# Patient Record
Sex: Female | Born: 1970
Health system: Southern US, Community
[De-identification: ages and names within clinical notes are randomized; demographics above are authoritative.]

## PROBLEM LIST (undated history)

## (undated) DIAGNOSIS — D509 Iron deficiency anemia, unspecified: Secondary | ICD-10-CM

## (undated) DIAGNOSIS — I872 Venous insufficiency (chronic) (peripheral): Secondary | ICD-10-CM

## (undated) DIAGNOSIS — G43909 Migraine, unspecified, not intractable, without status migrainosus: Secondary | ICD-10-CM

## (undated) HISTORY — DX: Migraine, unspecified, not intractable, without status migrainosus: G43.909

## (undated) HISTORY — DX: Iron deficiency anemia, unspecified: D50.9

## (undated) HISTORY — DX: Venous insufficiency (chronic) (peripheral): I87.2

---

## 2015-05-23 ENCOUNTER — Telehealth: Payer: Self-pay | Admitting: *Deleted

## 2015-05-23 NOTE — Telephone Encounter (Signed)
No phone number in chart. Unable to call patient.

## 2015-05-24 ENCOUNTER — Encounter: Payer: Self-pay | Admitting: Physician Assistant

## 2015-05-24 ENCOUNTER — Ambulatory Visit (INDEPENDENT_AMBULATORY_CARE_PROVIDER_SITE_OTHER): Payer: BLUE CROSS/BLUE SHIELD | Admitting: Physician Assistant

## 2015-05-24 VITALS — BP 112/80 | HR 79 | Temp 97.8°F | Ht <= 58 in | Wt 113.2 lb

## 2015-05-24 DIAGNOSIS — Z1239 Encounter for other screening for malignant neoplasm of breast: Secondary | ICD-10-CM | POA: Diagnosis not present

## 2015-05-24 DIAGNOSIS — H6123 Impacted cerumen, bilateral: Secondary | ICD-10-CM | POA: Diagnosis not present

## 2015-05-24 DIAGNOSIS — Z0000 Encounter for general adult medical examination without abnormal findings: Secondary | ICD-10-CM | POA: Insufficient documentation

## 2015-05-24 DIAGNOSIS — Z7689 Persons encountering health services in other specified circumstances: Secondary | ICD-10-CM

## 2015-05-24 DIAGNOSIS — H6120 Impacted cerumen, unspecified ear: Secondary | ICD-10-CM | POA: Insufficient documentation

## 2015-05-24 DIAGNOSIS — Z0289 Encounter for other administrative examinations: Secondary | ICD-10-CM | POA: Diagnosis not present

## 2015-05-24 LAB — COMPREHENSIVE METABOLIC PANEL
ALBUMIN: 4.1 g/dL (ref 3.5–5.2)
ALT: 24 U/L (ref 0–35)
AST: 18 U/L (ref 0–37)
Alkaline Phosphatase: 62 U/L (ref 39–117)
BUN: 7 mg/dL (ref 6–23)
CALCIUM: 9.2 mg/dL (ref 8.4–10.5)
CHLORIDE: 104 meq/L (ref 96–112)
CO2: 28 mEq/L (ref 19–32)
CREATININE: 0.49 mg/dL (ref 0.40–1.20)
GFR: 145.51 mL/min (ref >60.00)
Glucose, Bld: 91 mg/dL (ref 70–99)
POTASSIUM: 4.1 meq/L (ref 3.5–5.1)
Sodium: 138 mEq/L (ref 135–145)
Total Bilirubin: 0.5 mg/dL (ref 0.2–1.2)
Total Protein: 7.5 g/dL (ref 6.0–8.3)

## 2015-05-24 LAB — CBC
HEMATOCRIT: 29.1 % — AB (ref 36.0–46.0)
Hemoglobin: 9 g/dL — ABNORMAL LOW (ref 12.0–15.0)
MCHC: 30.9 g/dL (ref 30.0–36.0)
MCV: 69.9 fl — AB (ref 78.0–100.0)
PLATELETS: 388 10*3/uL (ref 150.0–400.0)
RBC: 4.16 Mil/uL (ref 3.87–5.11)
RDW: 20.2 % — ABNORMAL HIGH (ref 11.5–15.5)
WBC: 5.6 10*3/uL (ref 4.0–10.5)

## 2015-05-24 LAB — LIPID PANEL
CHOL/HDL RATIO: 2
CHOLESTEROL: 152 mg/dL (ref 0–200)
HDL: 63.3 mg/dL (ref >39.00)
LDL Cholesterol: 72 mg/dL (ref 0–99)
NONHDL: 88.64
Triglycerides: 81 mg/dL (ref 0.0–149.0)
VLDL: 16.2 mg/dL (ref 0.0–40.0)

## 2015-05-24 LAB — HEMOGLOBIN A1C: Hgb A1c MFr Bld: 6.1 % (ref 4.6–6.5)

## 2015-05-24 LAB — TSH: TSH: 1.87 u[IU]/mL (ref 0.35–4.50)

## 2015-05-24 NOTE — Assessment & Plan Note (Signed)
Order for screening mammogram placed.

## 2015-05-24 NOTE — Progress Notes (Signed)
Patient presents to clinic today as a new patient for annual exam.  Patient is fasting for labs. Body mass index is 24.06 kg/(m^2). Endorses well-balanced diet.   Acute Concerns: Patient endorses trouble with cerumen impactions. Endorses some decreased hearing. Denies tinnitus, ear pain or drainage.  Health Maintenance: Immunizations -- Tetanus up-to-date per patient.  Mammogram -- Overdue. Agrees to have done. Denies family history of breast cancer. PAP -- Overdue. Denies history of abnormal PAP smears. Requests GYN referral.  Past Medical History  Diagnosis Date  . Migraine     Past Surgical History  Procedure Laterality Date  . Cesarean section  2010/2011    No current outpatient prescriptions on file prior to visit.   No current facility-administered medications on file prior to visit.    No Known Allergies  Family History  Problem Relation Age of Onset  . Heart disease Mother   . Hypertension Sister   . Hypertension Brother   . Healthy Child     x 3 -- 42, 40 and 59 years old    Social History   Social History  . Marital Status: Married    Spouse Name: N/A  . Number of Children: 3  . Years of Education: N/A   Occupational History  . Housewife    Social History Main Topics  . Smoking status: Never Smoker   . Smokeless tobacco: Never Used  . Alcohol Use: 0.0 oz/week    0 Standard drinks or equivalent per week     Comment: [employer]   . Drug Use: No  . Sexual Activity: Yes   Other Topics Concern  . Not on file   Social History Narrative   Review of Systems  Constitutional: Negative for fever and weight loss.  HENT: Positive for hearing loss. Negative for ear discharge, ear pain and tinnitus.        + ear wax buildup  Eyes: Negative for blurred vision, double vision, photophobia and pain.  Respiratory: Negative for cough and shortness of breath.   Cardiovascular: Negative for chest pain and palpitations.  Gastrointestinal: Negative for  heartburn, nausea, vomiting, abdominal pain, diarrhea, constipation, blood in stool and melena.  Genitourinary: Negative for dysuria, urgency, frequency, hematuria and flank pain.  Musculoskeletal: Negative for falls.  Neurological: Negative for dizziness, loss of consciousness and headaches.  Endo/Heme/Allergies: Negative for environmental allergies.  Psychiatric/Behavioral: Negative for depression, suicidal ideas, hallucinations and substance abuse. The patient is not nervous/anxious and does not have insomnia.     BP 112/80 mmHg  Pulse 79  Temp(Src) 97.8 F (36.6 C) (Oral)  Ht 4' 9.5" (1.461 m)  Wt 113 lb 3.2 oz (51.347 kg)  BMI 24.06 kg/m2  SpO2 99%  LMP 05/24/2015  Physical Exam  Constitutional: She is oriented to person, place, and time and well-developed, well-nourished, and in no distress.  HENT:  Head: Normocephalic and atraumatic.  Right Ear: Tympanic membrane, external ear and ear canal normal.  Left Ear: Tympanic membrane, external ear and ear canal normal.  Nose: Nose normal. No mucosal edema.  Mouth/Throat: Uvula is midline, oropharynx is clear and moist and mucous membranes are normal. No oropharyngeal exudate or posterior oropharyngeal erythema.  Cerumen impaction noted bilaterally  Eyes: Conjunctivae are normal. Pupils are equal, round, and reactive to light.  Neck: Neck supple. No thyromegaly present.  Cardiovascular: Normal rate, regular rhythm, normal heart sounds and intact distal pulses.   Pulmonary/Chest: Effort normal and breath sounds normal. No respiratory distress. She has no wheezes. She  has no rales.  Abdominal: Soft. Bowel sounds are normal. She exhibits no distension and no mass. There is no tenderness. There is no rebound and no guarding.  Lymphadenopathy:    She has no cervical adenopathy.  Neurological: She is alert and oriented to person, place, and time. No cranial nerve deficit.  Skin: Skin is warm and dry. No rash noted.  Psychiatric: Affect  normal.  Vitals reviewed.   Assessment/Plan: Visit for preventive health examination Depression screen negative. Health Maintenance reviewed -- Referral to GYN placed for routine care and PAP smear. Order for mammogram placed. Immunizations up-to-date per patient. Preventive schedule discussed and handout given in AVS. Will obtain fasting labs today.   Cerumen impaction Removed successfully via irrigation. Hearing improved. Supportive measures reviewed.  Breast cancer screening Order for screening mammogram placed.

## 2015-05-24 NOTE — Assessment & Plan Note (Signed)
Removed successfully via irrigation. Hearing improved. Supportive measures reviewed.

## 2015-05-24 NOTE — Patient Instructions (Signed)
Please go to the lab for blood work.   Our office will call you with your results unless you have chosen to receive results via MyChart.  If your blood work is normal we will follow-up each year for physicals and as scheduled for chronic medical problems.  If anything is abnormal we will treat accordingly and get you in for a follow-up.  Preventive Care for Adults, Female A healthy lifestyle and preventive care can promote health and wellness. Preventive health guidelines for women include the following key practices.  A routine yearly physical is a good way to check with your health care provider about your health and preventive screening. It is a chance to share any concerns and updates on your health and to receive a thorough exam.  Visit your dentist for a routine exam and preventive care every 6 months. Brush your teeth twice a day and floss once a day. Good oral hygiene prevents tooth decay and gum disease.  The frequency of eye exams is based on your age, health, family medical history, use of contact lenses, and other factors. Follow your health care provider's recommendations for frequency of eye exams.  Eat a healthy diet. Foods like vegetables, fruits, whole grains, low-fat dairy products, and lean protein foods contain the nutrients you need without too many calories. Decrease your intake of foods high in solid fats, added sugars, and salt. Eat the right amount of calories for you.Get information about a proper diet from your health care provider, if necessary.  Regular physical exercise is one of the most important things you can do for your health. Most adults should get at least 150 minutes of moderate-intensity exercise (any activity that increases your heart rate and causes you to sweat) each week. In addition, most adults need muscle-strengthening exercises on 2 or more days a week.  Maintain a healthy weight. The body mass index (BMI) is a screening tool to identify possible  weight problems. It provides an estimate of body fat based on height and weight. Your health care provider can find your BMI and can help you achieve or maintain a healthy weight.For adults 20 years and older:  A BMI below 18.5 is considered underweight.  A BMI of 18.5 to 24.9 is normal.  A BMI of 25 to 29.9 is considered overweight.  A BMI of 30 and above is considered obese.  Maintain normal blood lipids and cholesterol levels by exercising and minimizing your intake of saturated fat. Eat a balanced diet with plenty of fruit and vegetables. Blood tests for lipids and cholesterol should begin at age 43 and be repeated every 5 years. If your lipid or cholesterol levels are high, you are over 50, or you are at high risk for heart disease, you may need your cholesterol levels checked more frequently.Ongoing high lipid and cholesterol levels should be treated with medicines if diet and exercise are not working.  If you smoke, find out from your health care provider how to quit. If you do not use tobacco, do not start.  Lung cancer screening is recommended for adults aged 32-80 years who are at high risk for developing lung cancer because of a history of smoking. A yearly low-dose CT scan of the lungs is recommended for people who have at least a 30-pack-year history of smoking and are a current smoker or have quit within the past 15 years. A pack year of smoking is smoking an average of 1 pack of cigarettes a day for 1 year (  for example: 1 pack a day for 30 years or 2 packs a day for 15 years). Yearly screening should continue until the smoker has stopped smoking for at least 15 years. Yearly screening should be stopped for people who develop a health problem that would prevent them from having lung cancer treatment.  If you are pregnant, do not drink alcohol. If you are breastfeeding, be very cautious about drinking alcohol. If you are not pregnant and choose to drink alcohol, do not have more than 1  drink per day. One drink is considered to be 12 ounces (355 mL) of beer, 5 ounces (148 mL) of wine, or 1.5 ounces (44 mL) of liquor.  Avoid use of street drugs. Do not share needles with anyone. Ask for help if you need support or instructions about stopping the use of drugs.  High blood pressure causes heart disease and increases the risk of stroke. Your blood pressure should be checked at least every 1 to 2 years. Ongoing high blood pressure should be treated with medicines if weight loss and exercise do not work.  If you are 28-30 years old, ask your health care provider if you should take aspirin to prevent strokes.  Diabetes screening is done by taking a blood sample to check your blood glucose level after you have not eaten for a certain period of time (fasting). If you are not overweight and you do not have risk factors for diabetes, you should be screened once every 3 years starting at age 65. If you are overweight or obese and you are 38-54 years of age, you should be screened for diabetes every year as part of your cardiovascular risk assessment.  Breast cancer screening is essential preventive care for women. You should practice "breast self-awareness." This means understanding the normal appearance and feel of your breasts and may include breast self-examination. Any changes detected, no matter how small, should be reported to a health care provider. Women in their 25s and 30s should have a clinical breast exam (CBE) by a health care provider as part of a regular health exam every 1 to 3 years. After age 23, women should have a CBE every year. Starting at age 33, women should consider having a mammogram (breast X-ray test) every year. Women who have a family history of breast cancer should talk to their health care provider about genetic screening. Women at a high risk of breast cancer should talk to their health care providers about having an MRI and a mammogram every year.  Breast cancer  gene (BRCA)-related cancer risk assessment is recommended for women who have family members with BRCA-related cancers. BRCA-related cancers include breast, ovarian, tubal, and peritoneal cancers. Having family members with these cancers may be associated with an increased risk for harmful changes (mutations) in the breast cancer genes BRCA1 and BRCA2. Results of the assessment will determine the need for genetic counseling and BRCA1 and BRCA2 testing.  Your health care provider may recommend that you be screened regularly for cancer of the pelvic organs (ovaries, uterus, and vagina). This screening involves a pelvic examination, including checking for microscopic changes to the surface of your cervix (Pap test). You may be encouraged to have this screening done every 3 years, beginning at age 84.  For women ages 42-65, health care providers may recommend pelvic exams and Pap testing every 3 years, or they may recommend the Pap and pelvic exam, combined with testing for human papilloma virus (HPV), every 5 years. Some types  of HPV increase your risk of cervical cancer. Testing for HPV may also be done on women of any age with unclear Pap test results.  Other health care providers may not recommend any screening for nonpregnant women who are considered low risk for pelvic cancer and who do not have symptoms. Ask your health care provider if a screening pelvic exam is right for you.  If you have had past treatment for cervical cancer or a condition that could lead to cancer, you need Pap tests and screening for cancer for at least 20 years after your treatment. If Pap tests have been discontinued, your risk factors (such as having a new sexual partner) need to be reassessed to determine if screening should resume. Some women have medical problems that increase the chance of getting cervical cancer. In these cases, your health care provider may recommend more frequent screening and Pap tests.  Colorectal  cancer can be detected and often prevented. Most routine colorectal cancer screening begins at the age of 78 years and continues through age 12 years. However, your health care provider may recommend screening at an earlier age if you have risk factors for colon cancer. On a yearly basis, your health care provider may provide home test kits to check for hidden blood in the stool. Use of a small camera at the end of a tube, to directly examine the colon (sigmoidoscopy or colonoscopy), can detect the earliest forms of colorectal cancer. Talk to your health care provider about this at age 86, when routine screening begins. Direct exam of the colon should be repeated every 5-10 years through age 59 years, unless early forms of precancerous polyps or small growths are found.  People who are at an increased risk for hepatitis B should be screened for this virus. You are considered at high risk for hepatitis B if:  You were born in a country where hepatitis B occurs often. Talk with your health care provider about which countries are considered high risk.  Your parents were born in a high-risk country and you have not received a shot to protect against hepatitis B (hepatitis B vaccine).  You have HIV or AIDS.  You use needles to inject street drugs.  You live with, or have sex with, someone who has hepatitis B.  You get hemodialysis treatment.  You take certain medicines for conditions like cancer, organ transplantation, and autoimmune conditions.  Hepatitis C blood testing is recommended for all people born from [birth year] through 1965 and any individual with known risks for hepatitis C.  Practice safe sex. Use condoms and avoid high-risk sexual practices to reduce the spread of sexually transmitted infections (STIs). STIs include gonorrhea, chlamydia, syphilis, trichomonas, herpes, HPV, and human immunodeficiency virus (HIV). Herpes, HIV, and HPV are viral illnesses that have no cure. They can result in  disability, cancer, and death.  You should be screened for sexually transmitted illnesses (STIs) including gonorrhea and chlamydia if:  You are sexually active and are younger than 24 years.  You are older than 24 years and your health care provider tells you that you are at risk for this type of infection.  Your sexual activity has changed since you were last screened and you are at an increased risk for chlamydia or gonorrhea. Ask your health care provider if you are at risk.  If you are at risk of being infected with HIV, it is recommended that you take a prescription medicine daily to prevent HIV infection. This is called  preexposure prophylaxis (PrEP). You are considered at risk if:  You are sexually active and do not regularly use condoms or know the HIV status of your partner(s).  You take drugs by injection.  You are sexually active with a partner who has HIV.  Talk with your health care provider about whether you are at high risk of being infected with HIV. If you choose to begin PrEP, you should first be tested for HIV. You should then be tested every 3 months for as long as you are taking PrEP.  Osteoporosis is a disease in which the bones lose minerals and strength with aging. This can result in serious bone fractures or breaks. The risk of osteoporosis can be identified using a bone density scan. Women ages 21 years and over and women at risk for fractures or osteoporosis should discuss screening with their health care providers. Ask your health care provider whether you should take a calcium supplement or vitamin D to reduce the rate of osteoporosis.  Menopause can be associated with physical symptoms and risks. Hormone replacement therapy is available to decrease symptoms and risks. You should talk to your health care provider about whether hormone replacement therapy is right for you.  Use sunscreen. Apply sunscreen liberally and repeatedly throughout the day. You should seek  shade when your shadow is shorter than you. Protect yourself by wearing long sleeves, pants, a wide-brimmed hat, and sunglasses year round, whenever you are outdoors.  Once a month, do a whole body skin exam, using a mirror to look at the skin on your back. Tell your health care provider of new moles, moles that have irregular borders, moles that are larger than a pencil eraser, or moles that have changed in shape or color.  Stay current with required vaccines (immunizations).  Influenza vaccine. All adults should be immunized every year.  Tetanus, diphtheria, and acellular pertussis (Td, Tdap) vaccine. Pregnant women should receive 1 dose of Tdap vaccine during each pregnancy. The dose should be obtained regardless of the length of time since the last dose. Immunization is preferred during the 27th-36th week of gestation. An adult who has not previously received Tdap or who does not know her vaccine status should receive 1 dose of Tdap. This initial dose should be followed by tetanus and diphtheria toxoids (Td) booster doses every 10 years. Adults with an unknown or incomplete history of completing a 3-dose immunization series with Td-containing vaccines should begin or complete a primary immunization series including a Tdap dose. Adults should receive a Td booster every 10 years.  Varicella vaccine. An adult without evidence of immunity to varicella should receive 2 doses or a second dose if she has previously received 1 dose. Pregnant females who do not have evidence of immunity should receive the first dose after pregnancy. This first dose should be obtained before leaving the health care facility. The second dose should be obtained 4-8 weeks after the first dose.  Human papillomavirus (HPV) vaccine. Females aged 13-26 years who have not received the vaccine previously should obtain the 3-dose series. The vaccine is not recommended for use in pregnant females. However, pregnancy testing is not needed  before receiving a dose. If a female is found to be pregnant after receiving a dose, no treatment is needed. In that case, the remaining doses should be delayed until after the pregnancy. Immunization is recommended for any person with an immunocompromised condition through the age of 23 years if she did not get any or all  doses earlier. During the 3-dose series, the second dose should be obtained 4-8 weeks after the first dose. The third dose should be obtained 24 weeks after the first dose and 16 weeks after the second dose.  Zoster vaccine. One dose is recommended for adults aged 62 years or older unless certain conditions are present.  Measles, mumps, and rubella (MMR) vaccine. Adults born before [birth year] generally are considered immune to measles and mumps. Adults born in [birth year] or later should have 1 or more doses of MMR vaccine unless there is a contraindication to the vaccine or there is laboratory evidence of immunity to each of the three diseases. A routine second dose of MMR vaccine should be obtained at least 28 days after the first dose for students attending postsecondary schools, health care workers, or international travelers. People who received inactivated measles vaccine or an unknown type of measles vaccine during 1963-1967 should receive 2 doses of MMR vaccine. People who received inactivated mumps vaccine or an unknown type of mumps vaccine before 1979 and are at high risk for mumps infection should consider immunization with 2 doses of MMR vaccine. For females of childbearing age, rubella immunity should be determined. If there is no evidence of immunity, females who are not pregnant should be vaccinated. If there is no evidence of immunity, females who are pregnant should delay immunization until after pregnancy. Unvaccinated health care workers born before [birth year] who lack laboratory evidence of measles, mumps, or rubella immunity or laboratory confirmation of disease should consider measles and  mumps immunization with 2 doses of MMR vaccine or rubella immunization with 1 dose of MMR vaccine.  Pneumococcal 13-valent conjugate (PCV13) vaccine. When indicated, a person who is uncertain of his immunization history and has no record of immunization should receive the PCV13 vaccine. All adults 3 years of age and older should receive this vaccine. An adult aged 65 years or older who has certain medical conditions and has not been previously immunized should receive 1 dose of PCV13 vaccine. This PCV13 should be followed with a dose of pneumococcal polysaccharide (PPSV23) vaccine. Adults who are at high risk for pneumococcal disease should obtain the PPSV23 vaccine at least 8 weeks after the dose of PCV13 vaccine. Adults older than 45 years of age who have normal immune system function should obtain the PPSV23 vaccine dose at least 1 year after the dose of PCV13 vaccine.  Pneumococcal polysaccharide (PPSV23) vaccine. When PCV13 is also indicated, PCV13 should be obtained first. All adults aged 8 years and older should be immunized. An adult younger than age 56 years who has certain medical conditions should be immunized. Any person who resides in a nursing home or long-term care facility should be immunized. An adult smoker should be immunized. People with an immunocompromised condition and certain other conditions should receive both PCV13 and PPSV23 vaccines. People with human immunodeficiency virus (HIV) infection should be immunized as soon as possible after diagnosis. Immunization during chemotherapy or radiation therapy should be avoided. Routine use of PPSV23 vaccine is not recommended for American Indians, [place] Natives, or people younger than 65 years unless there are medical conditions that require PPSV23 vaccine. When indicated, people who have unknown immunization and have no record of immunization should receive PPSV23 vaccine. One-time revaccination 5 years after the first dose of PPSV23 is  recommended for people aged 19-64 years who have chronic kidney failure, nephrotic syndrome, asplenia, or immunocompromised conditions. People who received 1-2 doses of PPSV23 before age 52 years should receive  another dose of PPSV23 vaccine at age 71 years or later if at least 5 years have passed since the previous dose. Doses of PPSV23 are not needed for people immunized with PPSV23 at or after age 41 years.  Meningococcal vaccine. Adults with asplenia or persistent complement component deficiencies should receive 2 doses of quadrivalent meningococcal conjugate (MenACWY-D) vaccine. The doses should be obtained at least 2 months apart. Microbiologists working with certain meningococcal bacteria, [employer] recruits, people at risk during an outbreak, and people who travel to or live in countries with a high rate of meningitis should be immunized. A first-year college student up through age 84 years who is living in a residence hall should receive a dose if she did not receive a dose on or after her 16th birthday. Adults who have certain high-risk conditions should receive one or more doses of vaccine.  Hepatitis A vaccine. Adults who wish to be protected from this disease, have certain high-risk conditions, work with hepatitis A-infected animals, work in hepatitis A research labs, or travel to or work in countries with a high rate of hepatitis A should be immunized. Adults who were previously unvaccinated and who anticipate close contact with an international adoptee during the first 60 days after arrival in the [place] from a country with a high rate of hepatitis A should be immunized.  Hepatitis B vaccine. Adults who wish to be protected from this disease, have certain high-risk conditions, may be exposed to blood or other infectious body fluids, are household contacts or sex partners of hepatitis B positive people, are clients or workers in certain care facilities, or travel to or work in countries  with a high rate of hepatitis B should be immunized.  Haemophilus influenzae type b (Hib) vaccine. A previously unvaccinated person with asplenia or sickle cell disease or having a scheduled splenectomy should receive 1 dose of Hib vaccine. Regardless of previous immunization, a recipient of a hematopoietic stem cell transplant should receive a 3-dose series 6-12 months after her successful transplant. Hib vaccine is not recommended for adults with HIV infection. Preventive Services / Frequency Ages 36 to 72 years  Blood pressure check.** / Every 3-5 years.  Lipid and cholesterol check.** / Every 5 years beginning at age 41.  Clinical breast exam.** / Every 3 years for women in their 19s and 70s.  BRCA-related cancer risk assessment.** / For women who have family members with a BRCA-related cancer (breast, ovarian, tubal, or peritoneal cancers).  Pap test.** / Every 2 years from ages 10 through 22. Every 3 years starting at age 44 through age 81 or 54 with a history of 3 consecutive normal Pap tests.  HPV screening.** / Every 3 years from ages 43 through ages 49 to 86 with a history of 3 consecutive normal Pap tests.  Hepatitis C blood test.** / For any individual with known risks for hepatitis C.  Skin self-exam. / Monthly.  Influenza vaccine. / Every year.  Tetanus, diphtheria, and acellular pertussis (Tdap, Td) vaccine.** / Consult your health care provider. Pregnant women should receive 1 dose of Tdap vaccine during each pregnancy. 1 dose of Td every 10 years.  Varicella vaccine.** / Consult your health care provider. Pregnant females who do not have evidence of immunity should receive the first dose after pregnancy.  HPV vaccine. / 3 doses over 6 months, if 32 and younger. The vaccine is not recommended for use in pregnant females. However, pregnancy testing is not needed before receiving a dose.  Measles, mumps, rubella (MMR) vaccine.** / You need at least 1 dose of MMR if you  were born in 1957 or later. You may also need a 2nd dose. For females of childbearing age, rubella immunity should be determined. If there is no evidence of immunity, females who are not pregnant should be vaccinated. If there is no evidence of immunity, females who are pregnant should delay immunization until after pregnancy.  Pneumococcal 13-valent conjugate (PCV13) vaccine.** / Consult your health care provider.  Pneumococcal polysaccharide (PPSV23) vaccine.** / 1 to 2 doses if you smoke cigarettes or if you have certain conditions.  Meningococcal vaccine.** / 1 dose if you are age 25 to 49 years and a Market researcher living in a residence hall, or have one of several medical conditions, you need to get vaccinated against meningococcal disease. You may also need additional booster doses.  Hepatitis A vaccine.** / Consult your health care provider.  Hepatitis B vaccine.** / Consult your health care provider.  Haemophilus influenzae type b (Hib) vaccine.** / Consult your health care provider. Ages 69 to 42 years  Blood pressure check.** / Every year.  Lipid and cholesterol check.** / Every 5 years beginning at age 44 years.  Lung cancer screening. / Every year if you are aged 61-80 years and have a 30-pack-year history of smoking and currently smoke or have quit within the past 15 years. Yearly screening is stopped once you have quit smoking for at least 15 years or develop a health problem that would prevent you from having lung cancer treatment.  Clinical breast exam.** / Every year after age 79 years.  BRCA-related cancer risk assessment.** / For women who have family members with a BRCA-related cancer (breast, ovarian, tubal, or peritoneal cancers).  Mammogram.** / Every year beginning at age 30 years and continuing for as long as you are in good health. Consult with your health care provider.  Pap test.** / Every 3 years starting at age 73 years through age 47 or 32 years  with a history of 3 consecutive normal Pap tests.  HPV screening.** / Every 3 years from ages 62 years through ages 66 to 73 years with a history of 3 consecutive normal Pap tests.  Fecal occult blood test (FOBT) of stool. / Every year beginning at age 82 years and continuing until age 39 years. You may not need to do this test if you get a colonoscopy every 10 years.  Flexible sigmoidoscopy or colonoscopy.** / Every 5 years for a flexible sigmoidoscopy or every 10 years for a colonoscopy beginning at age 55 years and continuing until age 67 years.  Hepatitis C blood test.** / For all people born from [birth year] through 1965 and any individual with known risks for hepatitis C.  Skin self-exam. / Monthly.  Influenza vaccine. / Every year.  Tetanus, diphtheria, and acellular pertussis (Tdap/Td) vaccine.** / Consult your health care provider. Pregnant women should receive 1 dose of Tdap vaccine during each pregnancy. 1 dose of Td every 10 years.  Varicella vaccine.** / Consult your health care provider. Pregnant females who do not have evidence of immunity should receive the first dose after pregnancy.  Zoster vaccine.** / 1 dose for adults aged 38 years or older.  Measles, mumps, rubella (MMR) vaccine.** / You need at least 1 dose of MMR if you were born in 1957 or later. You may also need a second dose. For females of childbearing age, rubella immunity should be determined. If there is no  evidence of immunity, females who are not pregnant should be vaccinated. If there is no evidence of immunity, females who are pregnant should delay immunization until after pregnancy.  Pneumococcal 13-valent conjugate (PCV13) vaccine.** / Consult your health care provider.  Pneumococcal polysaccharide (PPSV23) vaccine.** / 1 to 2 doses if you smoke cigarettes or if you have certain conditions.  Meningococcal vaccine.** / Consult your health care provider.  Hepatitis A vaccine.** / Consult your health care  provider.  Hepatitis B vaccine.** / Consult your health care provider.  Haemophilus influenzae type b (Hib) vaccine.** / Consult your health care provider. Ages 12 years and over  Blood pressure check.** / Every year.  Lipid and cholesterol check.** / Every 5 years beginning at age 70 years.  Lung cancer screening. / Every year if you are aged 56-80 years and have a 30-pack-year history of smoking and currently smoke or have quit within the past 15 years. Yearly screening is stopped once you have quit smoking for at least 15 years or develop a health problem that would prevent you from having lung cancer treatment.  Clinical breast exam.** / Every year after age 73 years.  BRCA-related cancer risk assessment.** / For women who have family members with a BRCA-related cancer (breast, ovarian, tubal, or peritoneal cancers).  Mammogram.** / Every year beginning at age 11 years and continuing for as long as you are in good health. Consult with your health care provider.  Pap test.** / Every 3 years starting at age 73 years through age 53 or 59 years with 3 consecutive normal Pap tests. Testing can be stopped between 65 and 70 years with 3 consecutive normal Pap tests and no abnormal Pap or HPV tests in the past 10 years.  HPV screening.** / Every 3 years from ages 50 years through ages 65 or 75 years with a history of 3 consecutive normal Pap tests. Testing can be stopped between 65 and 70 years with 3 consecutive normal Pap tests and no abnormal Pap or HPV tests in the past 10 years.  Fecal occult blood test (FOBT) of stool. / Every year beginning at age 65 years and continuing until age 33 years. You may not need to do this test if you get a colonoscopy every 10 years.  Flexible sigmoidoscopy or colonoscopy.** / Every 5 years for a flexible sigmoidoscopy or every 10 years for a colonoscopy beginning at age 49 years and continuing until age 89 years.  Hepatitis C blood test.** / For all  people born from [birth year] through 1965 and any individual with known risks for hepatitis C.  Osteoporosis screening.** / A one-time screening for women ages 2 years and over and women at risk for fractures or osteoporosis.  Skin self-exam. / Monthly.  Influenza vaccine. / Every year.  Tetanus, diphtheria, and acellular pertussis (Tdap/Td) vaccine.** / 1 dose of Td every 10 years.  Varicella vaccine.** / Consult your health care provider.  Zoster vaccine.** / 1 dose for adults aged 36 years or older.  Pneumococcal 13-valent conjugate (PCV13) vaccine.** / Consult your health care provider.  Pneumococcal polysaccharide (PPSV23) vaccine.** / 1 dose for all adults aged 109 years and older.  Meningococcal vaccine.** / Consult your health care provider.  Hepatitis A vaccine.** / Consult your health care provider.  Hepatitis B vaccine.** / Consult your health care provider.  Haemophilus influenzae type b (Hib) vaccine.** / Consult your health care provider. ** Family history and personal history of risk and conditions may change your health  care provider's recommendations.   This information is not intended to replace advice given to you by your health care provider. Make sure you discuss any questions you have with your health care provider.   Document Released: 03/12/2001 Document Revised: 02/04/2014 Document Reviewed: 06/11/2010 Elsevier Interactive Patient Education [employer]. .

## 2015-05-24 NOTE — Progress Notes (Signed)
Pre visit review using our clinic review tool, if applicable. No additional management support is needed unless otherwise documented below in the visit note. 

## 2015-05-24 NOTE — Addendum Note (Signed)
Addended by: Harl Bowie on: 05/24/2015 10:30 AM   Modules accepted: Orders

## 2015-05-24 NOTE — Assessment & Plan Note (Signed)
Depression screen negative. Health Maintenance reviewed -- Referral to GYN placed for routine care and PAP smear. Order for mammogram placed. Immunizations up-to-date per patient. Preventive schedule discussed and handout given in AVS. Will obtain fasting labs today.

## 2015-05-25 MED ORDER — FERROUS SULFATE 325 (65 FE) MG PO TABS: 325.0000 mg | ORAL_TABLET | Freq: Two times a day (BID) | ORAL | Status: AC

## 2015-05-25 NOTE — Addendum Note (Signed)
Addended by: Dorrene German on: 05/25/2015 02:58 PM   Modules accepted: Orders

## 2015-05-29 ENCOUNTER — Ambulatory Visit (INDEPENDENT_AMBULATORY_CARE_PROVIDER_SITE_OTHER): Payer: BLUE CROSS/BLUE SHIELD | Admitting: Physician Assistant

## 2015-05-29 ENCOUNTER — Encounter: Payer: Self-pay | Admitting: Physician Assistant

## 2015-05-29 VITALS — BP 124/82 | HR 79 | Temp 97.7°F | Resp 16 | Ht <= 58 in | Wt 113.5 lb

## 2015-05-29 DIAGNOSIS — D509 Iron deficiency anemia, unspecified: Secondary | ICD-10-CM | POA: Diagnosis not present

## 2015-05-29 NOTE — Assessment & Plan Note (Addendum)
Suspect heavy cycles are main contributor. She will start iron supplement as directed. Will recheck CBC and iron panel today. Will alter regimen based on results. IFOB given. If not already improving, will also get GI consult.

## 2015-05-29 NOTE — Progress Notes (Signed)
Patient presents to clinic today for follow-up of anemia noted on recent routine labs. Hgb at 9.0. Anemia microcytic.Marland Kitchen Patient endorses history of iron deficiency during pregnancy but no other diagnosis of anemia. Denies blood in stools. Denies stomach pain. Endorses some shortness of breath only with exertion. Denies chest pain or palpitations. LMP just finished on April 26th. Endorses periods are typically heavy, having to change 3-4 max flow pads per day.   Past Medical History  Diagnosis Date  . Migraine     Current Outpatient Prescriptions on File Prior to Visit  Medication Sig Dispense Refill  . ferrous sulfate 325 (65 FE) MG tablet Take 1 tablet (325 mg total) by mouth 2 (two) times daily with a meal. 60 tablet 0   No current facility-administered medications on file prior to visit.    No Known Allergies  Family History  Problem Relation Age of Onset  . Heart disease Mother   . Hypertension Sister   . Hypertension Brother   . Healthy Child     x 3 -- 13, 39 and 46 years old    Social History   Social History  . Marital Status: Married    Spouse Name: N/A  . Number of Children: 3  . Years of Education: N/A   Occupational History  . Housewife    Social History Main Topics  . Smoking status: Never Smoker   . Smokeless tobacco: Never Used  . Alcohol Use: 0.0 oz/week    0 Standard drinks or equivalent per week     Comment: [employer]-Wine   . Drug Use: No  . Sexual Activity: Yes   Other Topics Concern  . None   Social History Narrative   Review of Systems - See HPI.  All other ROS are negative.  BP 124/82 mmHg  Pulse 79  Temp(Src) 97.7 F (36.5 C) (Oral)  Resp 16  Ht 4' 9.5" (1.461 m)  Wt 113 lb 8 oz (51.483 kg)  BMI 24.12 kg/m2  SpO2 98%  LMP 05/24/2015  Physical Exam  Constitutional: She is oriented to person, place, and time and well-developed, well-nourished, and in no distress.  HENT:  Head: Normocephalic and atraumatic.  Eyes: Conjunctivae are  normal.  Cardiovascular: Normal rate, regular rhythm, normal heart sounds and intact distal pulses.   Pulmonary/Chest: Effort normal and breath sounds normal. No respiratory distress. She has no wheezes. She has no rales. She exhibits no tenderness.  Abdominal: Soft. Bowel sounds are normal. She exhibits no distension and no mass. There is no tenderness. There is no rebound and no guarding.  Neurological: She is alert and oriented to person, place, and time.  Skin: Skin is warm and dry. No rash noted.  Psychiatric: Affect normal.  Vitals reviewed.   Recent Results (from the past 2160 hour(s))  CBC     Status: Abnormal   Collection Time: 05/24/15  9:20 AM  Result Value Ref Range   WBC 5.6 4.0 - 10.5 K/uL   RBC 4.16 3.87 - 5.11 Mil/uL   Platelets 388.0 150.0 - 400.0 K/uL   Hemoglobin 9.0 (L) 12.0 - 15.0 g/dL   HCT 29.1 (L) 36.0 - 46.0 %   MCV 69.9 (L) 78.0 - 100.0 fl   MCHC 30.9 30.0 - 36.0 g/dL   RDW 20.2 (H) 11.5 - 15.5 %  Comprehensive metabolic panel     Status: None   Collection Time: 05/24/15  9:20 AM  Result Value Ref Range   Sodium 138 135 - 145  mEq/L   Potassium 4.1 3.5 - 5.1 mEq/L   Chloride 104 96 - 112 mEq/L   CO2 28 19 - 32 mEq/L   Glucose, Bld 91 70 - 99 mg/dL   BUN 7 6 - 23 mg/dL   Creatinine, Ser 0.49 0.40 - 1.20 mg/dL   Total Bilirubin 0.5 0.2 - 1.2 mg/dL   Alkaline Phosphatase 62 39 - 117 U/L   AST 18 0 - 37 U/L   ALT 24 0 - 35 U/L   Total Protein 7.5 6.0 - 8.3 g/dL   Albumin 4.1 3.5 - 5.2 g/dL   Calcium 9.2 8.4 - 10.5 mg/dL   GFR 145.51 >60.00 mL/min  Hemoglobin A1c     Status: None   Collection Time: 05/24/15  9:20 AM  Result Value Ref Range   Hgb A1c MFr Bld 6.1 4.6 - 6.5 %    Comment: Glycemic Control Guidelines for People with Diabetes:Non Diabetic:  <6%Goal of Therapy: <7%Additional Action Suggested:  >8%   Lipid panel     Status: None   Collection Time: 05/24/15  9:20 AM  Result Value Ref Range   Cholesterol 152 0 - 200 mg/dL    Comment: ATP III  Classification       Desirable:  < 200 mg/dL               Borderline High:  200 - 239 mg/dL          High:  > = 240 mg/dL   Triglycerides 81.0 0.0 - 149.0 mg/dL    Comment: Normal:  <150 mg/dLBorderline High:  150 - 199 mg/dL   HDL 63.30 >39.00 mg/dL   VLDL 16.2 0.0 - 40.0 mg/dL   LDL Cholesterol 72 0 - 99 mg/dL   Total CHOL/HDL Ratio 2     Comment:                Men          Women1/2 Average Risk     3.4          3.3Average Risk          5.0          4.42X Average Risk          9.6          7.13X Average Risk          15.0          11.0                       NonHDL 88.64     Comment: NOTE:  Non-HDL goal should be 30 mg/dL higher than patient's LDL goal (i.e. LDL goal of < 70 mg/dL, would have non-HDL goal of < 100 mg/dL)  TSH     Status: None   Collection Time: 05/24/15  9:20 AM  Result Value Ref Range   TSH 1.87 0.35 - 4.50 uIU/mL    Assessment/Plan: Microcytic anemia Suspect heavy cycles are main contributor. She will start iron supplement as directed. Will recheck CBC and iron panel today. Will alter regimen based on results. IFOB given. If not already improving, will also get GI consult.

## 2015-05-29 NOTE — Patient Instructions (Signed)
Please go to the lab for repeat blood work. I will call with your results. Please stay hydrated. Since insurance will not pay, you will need to get an over-the-counter iron supplement 325 mg to take twice daily.   We will alter regimen based on results. I feel your heavy menstrual flow is a big contributor. Now that you are off of your period the levels should improve. If no change, we will need to get a consult with Gastroenterology.

## 2015-05-30 LAB — IRON AND TIBC
%SAT: 88 % — ABNORMAL HIGH (ref 11–50)
Iron: 387 ug/dL — ABNORMAL HIGH (ref 40–190)
TIBC: 442 ug/dL (ref 250–450)
UIBC: 55 ug/dL — ABNORMAL LOW (ref 125–400)

## 2015-05-30 LAB — CBC
HEMATOCRIT: 27.7 % — AB (ref 36.0–46.0)
MCHC: 30.9 g/dL (ref 30.0–36.0)
PLATELETS: 402 10*3/uL — AB (ref 150.0–400.0)
RBC: 3.97 Mil/uL (ref 3.87–5.11)
RDW: 19.5 % — ABNORMAL HIGH (ref 11.5–15.5)
WBC: 8 10*3/uL (ref 4.0–10.5)

## 2015-05-30 LAB — FERRITIN: Ferritin: 14 ng/mL (ref 10.0–291.0)

## 2015-06-02 ENCOUNTER — Other Ambulatory Visit: Payer: BLUE CROSS/BLUE SHIELD

## 2015-06-05 ENCOUNTER — Other Ambulatory Visit (INDEPENDENT_AMBULATORY_CARE_PROVIDER_SITE_OTHER): Payer: BLUE CROSS/BLUE SHIELD

## 2015-06-05 DIAGNOSIS — D509 Iron deficiency anemia, unspecified: Secondary | ICD-10-CM

## 2015-06-05 NOTE — Addendum Note (Signed)
Addended by: Peggyann Shoals on: 06/05/2015 02:57 PM   Modules accepted: Orders

## 2015-06-05 NOTE — Addendum Note (Signed)
Addended by: Peggyann Shoals on: 06/05/2015 02:56 PM   Modules accepted: Orders

## 2015-06-06 LAB — FECAL OCCULT BLOOD, IMMUNOCHEMICAL: FECAL OCCULT BLD: NEGATIVE

## 2015-06-07 ENCOUNTER — Other Ambulatory Visit: Payer: Self-pay | Admitting: Physician Assistant

## 2015-06-07 ENCOUNTER — Encounter: Payer: Self-pay | Admitting: Internal Medicine

## 2015-06-07 ENCOUNTER — Telehealth: Payer: Self-pay | Admitting: Physician Assistant

## 2015-06-07 DIAGNOSIS — D509 Iron deficiency anemia, unspecified: Secondary | ICD-10-CM

## 2015-06-07 NOTE — Telephone Encounter (Signed)
Returned call. No answer, left message to return call.

## 2015-06-07 NOTE — Telephone Encounter (Signed)
Called pt and left detailed message per DPR w/ below instructions. Instructed to return call to schedule lab appt.   Notes Recorded by Brunetta Jeans, PA-C on 06/07/2015 at 8:21 AM Referral to Hematology and GI placed.  No Advil or NSAIDs due to anemia. She can use Tylenol if needed. I am glad she is feeling better. Want her to return to lab this Friday for final repeat CBC. If she develops any lightheadedness, SOB, palpitations they need to go to ER immediately.

## 2015-06-07 NOTE — Telephone Encounter (Signed)
Pt called back in. Scheduled lab appt per note. Pt says that she received vm from nurse but still would like a call back    CB: [phone]

## 2015-06-07 NOTE — Telephone Encounter (Signed)
Pt returned call for lab results. Request call back.  CB:[phone]

## 2015-06-07 NOTE — Telephone Encounter (Signed)
Pt returned call. Reviewed result note below. Pt w/ multiple concerns. Asked several times if Einar Pheasant would be in the office on Friday in case she had questions for him. Explained to pt x3 that she is coming on Friday for lab only appt, and she will not see Einar Pheasant that day. Pt encouraged to relay her questions at time of call to be addressed.  1. Pt wanted to know if she could still exercise on the treadmill given her anemia. Advised pt no strict exercise precaution documented, however, would recommend avoiding strenuous exercise such as running on the treadmill as this would likely exacerbate her SOB. Reiterated ED precautions.   2. Pt inquiring when she should take her iron supplement. Advised to take BID w/ meals as directed, no specific time, but to try to take around the same time each day. States she is currently taking 1 dose around 12-1pm and wanted to know when she should take second dose. Advised she could take other dose first thing in the morning w/ breakfast or later in the evening after dinner or w/ a snack at night.   3. Pt requested referral for "varicose veins" in her legs. She states she wants someone to take them out. Reports Einar Pheasant has not seen her for this before. Advised she would likely need OV w/ [facility] before he would be able to place referral. Please advise.   Pt verbalized understanding of above instructions, no further questions or concerns at time of call. [facility], please advise if any changes/further recommendations.

## 2015-06-07 NOTE — Telephone Encounter (Signed)
She will need OV to discuss venous varicosities and associated symptoms. Other instructions look good. Remind her about lab appointment Friday. If she does want to speak with me personally Friday, she should schedule OV that day.

## 2015-06-08 ENCOUNTER — Other Ambulatory Visit: Payer: Self-pay | Admitting: *Deleted

## 2015-06-08 DIAGNOSIS — D649 Anemia, unspecified: Secondary | ICD-10-CM

## 2015-06-08 NOTE — Telephone Encounter (Signed)
Called pt and left detailed message that she would need to call the office to schedule an appointment with [facility] for evaluation of varicose veins. Advised she could schedule on Friday 5/12 if she would like since she is already coming in for labs.

## 2015-06-09 ENCOUNTER — Encounter: Payer: Self-pay | Admitting: Physician Assistant

## 2015-06-09 ENCOUNTER — Ambulatory Visit (INDEPENDENT_AMBULATORY_CARE_PROVIDER_SITE_OTHER): Payer: BLUE CROSS/BLUE SHIELD | Admitting: Physician Assistant

## 2015-06-09 ENCOUNTER — Other Ambulatory Visit: Payer: BLUE CROSS/BLUE SHIELD

## 2015-06-09 VITALS — BP 106/74 | HR 91 | Temp 98.7°F | Resp 16 | Ht <= 58 in | Wt 112.4 lb

## 2015-06-09 DIAGNOSIS — R011 Cardiac murmur, unspecified: Secondary | ICD-10-CM

## 2015-06-09 DIAGNOSIS — I872 Venous insufficiency (chronic) (peripheral): Secondary | ICD-10-CM

## 2015-06-09 DIAGNOSIS — D509 Iron deficiency anemia, unspecified: Secondary | ICD-10-CM

## 2015-06-09 LAB — CBC
HCT: 33 % — ABNORMAL LOW (ref 36.0–46.0)
Hemoglobin: 10.3 g/dL — ABNORMAL LOW (ref 12.0–15.0)
MCHC: 31.2 g/dL (ref 30.0–36.0)
MCV: 74.4 fl — AB (ref 78.0–100.0)
Platelets: 325 10*3/uL (ref 150.0–400.0)
RBC: 4.44 Mil/uL (ref 3.87–5.11)
RDW: 28.4 % — ABNORMAL HIGH (ref 11.5–15.5)
WBC: 5.8 10*3/uL (ref 4.0–10.5)

## 2015-06-09 NOTE — Patient Instructions (Signed)
Please go to the lab for blood work. I will call you with results. Please continue your iron supplement as directed. Go to appointments with the Gastroenterologist and Hematologist as scheduled. If anemia is worsening on your labs I may have to send you to the ER. Also if you notice any shortness of breath, racing heart, lightheadedness please have someone take you to the ER or call 911.  You will be contacted for an echocardiogram and for assessment by Vascular Surgery.

## 2015-06-09 NOTE — Progress Notes (Signed)
Patient presents to clinic today for follow-up of iron deficiency anemia and to discuss acute concerns.  IDA -- Last hemoglobin/hematocrit noted to be at 8.6/27.7. Patient had just started iron supplementation at time of lab draw. Endorses taking twice daily as directed. IFOB was obtained and negative. Still denies lightheadedness, dizziness, palpitations or SOB. Referral to Hematology and GI have already been placed. Appointments pending with Hematology. GI appointment is scheduled for 5/18  Patient c/o long-standing history of varicose veins in leges bilaterally since the birth of her first child. Endorses aching pain in posterior legs with prolonged standing. Denies swelling. Denies skin changes but states the varicose veins have enlarged in size. Would like to see a specialist regarding regarding this.   Past Medical History  Diagnosis Date  . Migraine     Current Outpatient Prescriptions on File Prior to Visit  Medication Sig Dispense Refill  . Ascorbic Acid (VITAMIN C) 100 MG tablet Take 100 mg by mouth daily as needed (Migraines).     . ferrous sulfate 325 (65 FE) MG tablet Take 1 tablet (325 mg total) by mouth 2 (two) times daily with a meal. 60 tablet 0  . ibuprofen (ADVIL,MOTRIN) 200 MG tablet Take 200 mg by mouth every 8 (eight) hours as needed.     No current facility-administered medications on file prior to visit.    No Known Allergies  Family History  Problem Relation Age of Onset  . Heart disease Mother   . Hypertension Sister   . Hypertension Brother   . Healthy Child     x 3 -- 67, 42 and 14 years old    Social History   Social History  . Marital Status: Married    Spouse Name: N/A  . Number of Children: 3  . Years of Education: N/A   Occupational History  . Housewife    Social History Main Topics  . Smoking status: Never Smoker   . Smokeless tobacco: Never Used  . Alcohol Use: 0.0 oz/week    0 Standard drinks or equivalent per week     Comment:  Oc[employer]-Wine   . Drug Use: No  . Sexual Activity: Yes   Other Topics Concern  . None   Social History Narrative   Review of Systems - See HPI.  All other ROS are negative.  Ht 4' 9.5" (1.461 m)  Wt 112 lb 6 oz (50.973 kg)  BMI 23.88 kg/m2  LMP 05/24/2015  Physical Exam  Constitutional: She is oriented to person, place, and time and well-developed, well-nourished, and in no distress.  HENT:  Head: Normocephalic and atraumatic.  Mouth/Throat: Oropharynx is clear and moist.  Eyes: Conjunctivae are normal.  Neck: Neck supple.  Cardiovascular: Normal rate, regular rhythm and intact distal pulses.   Murmur heard.  Systolic murmur is present with a grade of 2/6  Pulses:      Popliteal pulses are 2+ on the right side, and 2+ on the left side.       Dorsalis pedis pulses are 2+ on the right side, and 2+ on the left side.       Posterior tibial pulses are 2+ on the right side, and 2+ on the left side.  Venous varicosities noted of bilateral posterior lower extremities with L worse than R. No edema noted. Good pulses. No evidence of skin changes associated with venous stasis dermatitis.   Neurological: She is alert and oriented to person, place, and time.  Skin: Skin is warm and  dry. No rash noted.  Vitals reviewed.   Recent Results (from the past 2160 hour(s))  CBC     Status: Abnormal   Collection Time: 05/24/15  9:20 AM  Result Value Ref Range   WBC 5.6 4.0 - 10.5 K/uL   RBC 4.16 3.87 - 5.11 Mil/uL   Platelets 388.0 150.0 - 400.0 K/uL   Hemoglobin 9.0 (L) 12.0 - 15.0 g/dL   HCT 29.1 (L) 36.0 - 46.0 %   MCV 69.9 (L) 78.0 - 100.0 fl   MCHC 30.9 30.0 - 36.0 g/dL   RDW 20.2 (H) 11.5 - 15.5 %  Comprehensive metabolic panel     Status: None   Collection Time: 05/24/15  9:20 AM  Result Value Ref Range   Sodium 138 135 - 145 mEq/L   Potassium 4.1 3.5 - 5.1 mEq/L   Chloride 104 96 - 112 mEq/L   CO2 28 19 - 32 mEq/L   Glucose, Bld 91 70 - 99 mg/dL   BUN 7 6 - 23 mg/dL    Creatinine, Ser 0.49 0.40 - 1.20 mg/dL   Total Bilirubin 0.5 0.2 - 1.2 mg/dL   Alkaline Phosphatase 62 39 - 117 U/L   AST 18 0 - 37 U/L   ALT 24 0 - 35 U/L   Total Protein 7.5 6.0 - 8.3 g/dL   Albumin 4.1 3.5 - 5.2 g/dL   Calcium 9.2 8.4 - 10.5 mg/dL   GFR 145.51 >60.00 mL/min  Hemoglobin A1c     Status: None   Collection Time: 05/24/15  9:20 AM  Result Value Ref Range   Hgb A1c MFr Bld 6.1 4.6 - 6.5 %    Comment: Glycemic Control Guidelines for People with Diabetes:Non Diabetic:  <6%Goal of Therapy: <7%Additional Action Suggested:  >8%   Lipid panel     Status: None   Collection Time: 05/24/15  9:20 AM  Result Value Ref Range   Cholesterol 152 0 - 200 mg/dL    Comment: ATP III Classification       Desirable:  < 200 mg/dL               Borderline High:  200 - 239 mg/dL          High:  > = 240 mg/dL   Triglycerides 81.0 0.0 - 149.0 mg/dL    Comment: Normal:  <150 mg/dLBorderline High:  150 - 199 mg/dL   HDL 63.30 >39.00 mg/dL   VLDL 16.2 0.0 - 40.0 mg/dL   LDL Cholesterol 72 0 - 99 mg/dL   Total CHOL/HDL Ratio 2     Comment:                Men          Women1/2 Average Risk     3.4          3.3Average Risk          5.0          4.42X Average Risk          9.6          7.13X Average Risk          15.0          11.0                       NonHDL 88.64     Comment: NOTE:  Non-HDL goal should be 30 mg/dL higher than patient's LDL goal (i.e. LDL goal  of < 70 mg/dL, would have non-HDL goal of < 100 mg/dL)  TSH     Status: None   Collection Time: 05/24/15  9:20 AM  Result Value Ref Range   TSH 1.87 0.35 - 4.50 uIU/mL  CBC     Status: Abnormal   Collection Time: 05/29/15  5:01 PM  Result Value Ref Range   WBC 8.0 4.0 - 10.5 K/uL   RBC 3.97 3.87 - 5.11 Mil/uL   Platelets 402.0 (H) 150.0 - 400.0 K/uL   Hemoglobin 8.6 Repeated and verified X2. (L) 12.0 - 15.0 g/dL   HCT 27.7 (L) 36.0 - 46.0 %   MCV 69.8 Repeated and verified X2. (L) 78.0 - 100.0 fl   MCHC 30.9 30.0 - 36.0 g/dL   RDW 19.5  (H) 11.5 - 15.5 %  Ferritin     Status: None   Collection Time: 05/29/15  5:01 PM  Result Value Ref Range   Ferritin 14.0 10.0 - 291.0 ng/mL  Iron Binding Cap (TIBC)     Status: Abnormal   Collection Time: 05/29/15  5:01 PM  Result Value Ref Range   Iron 387 (H) 40 - 190 ug/dL   UIBC 55 (L) 125 - 400 ug/dL   TIBC 442 250 - 450 ug/dL   %SAT 88 (H) 11 - 50 %  Fecal occult blood, imunochemical     Status: None   Collection Time: 06/05/15  3:07 PM  Result Value Ref Range   Fecal Occult Bld Negative Negative    Assessment/Plan: 1. Microcytic anemia Asymptomatic. GI appointment scheduled. Hematology appointment pending. Tolerating iron supplement well without side effects. Will continue. Will repeat STAT CBC today. If stable will continue current regimen. If decreased again, may need ER assessment.  2. Venous insufficiency Referral to Vascular placed for management of varicosities.  3. Undiagnosed cardiac murmurs Asymptomatic. Will order Echo to further assess.

## 2015-06-09 NOTE — Progress Notes (Signed)
Pre visit review using our clinic review tool, if applicable. No additional management support is needed unless otherwise documented below in the visit note/SLS  

## 2015-06-15 ENCOUNTER — Encounter: Payer: Self-pay | Admitting: Internal Medicine

## 2015-06-15 ENCOUNTER — Ambulatory Visit (INDEPENDENT_AMBULATORY_CARE_PROVIDER_SITE_OTHER): Payer: BLUE CROSS/BLUE SHIELD | Admitting: Internal Medicine

## 2015-06-15 VITALS — BP 126/74 | HR 84 | Ht <= 58 in | Wt 113.0 lb

## 2015-06-15 DIAGNOSIS — D509 Iron deficiency anemia, unspecified: Secondary | ICD-10-CM

## 2015-06-15 NOTE — Progress Notes (Signed)
Referred by Elyn Aquas PA-C Subjective:    Patient ID: Jade Kennedy, female    DOB: 11-Jan-1971, 45 y.o.   MRN: [id number] Cc: anemia HPI Very nice 45 yo woman with a new dx of microcytic anemia - ferritin 14 but iron saturation high at 88% She had a negative iFOBT. She has started ferrous sulfate with some rise in Hgb. Denies any GI sxs - no bleeding, change in bowels. She is not a blood donor. She does eat foods with iron. No prior hx anemia known, last medical evaluation in [place] 2-3 yrs ago - moved to [facility] for husband's job. She does vae persistent regular and heavy menses. She bleeds 5-6 d each month and days 4 and 5 are very heavy. No Known Allergies Outpatient Prescriptions Prior to Visit  Medication Sig Dispense Refill  . Ascorbic Acid (VITAMIN C) 100 MG tablet Take 100 mg by mouth daily as needed (Migraines).     . ferrous sulfate 325 (65 FE) MG tablet Take 1 tablet (325 mg total) by mouth 2 (two) times daily with a meal. 60 tablet 0   No facility-administered medications prior to visit.   Past Medical History  Diagnosis Date  . Migraine   . Microcytic anemia   . Venous insufficiency    Past Surgical History  Procedure Laterality Date  . Cesarean section  2010/2011   Social History   Social History  . Marital Status: Married    Spouse Name: N/A  . Number of Children: 3  . Years of Education: N/A   Occupational History  . Housewife    Social History Main Topics  . Smoking status: Never Smoker   . Smokeless tobacco: Never Used  . Alcohol Use: 0.0 oz/week    0 Standard drinks or equivalent per week     Comment: [employer]ine   . Drug Use: No  . Sexual Activity: Yes   Other Topics Concern  . None   Social History Narrative   Family History  Problem Relation Age of Onset  . Heart disease Mother   . Hypertension Sister   . Hypertension Brother   . Healthy Child     x 3 -- 79, 74 and 86 years old    Review of Systems As above    Objective:   Physical  Exam @BP  126/74 mmHg  Pulse 84  Ht 4' 9.5" (1.461 m)  Wt 113 lb (51.256 kg)  BMI 24.01 kg/m2  LMP 05/24/2015@  General:  Well-developed, well-nourished and in no acute distress Eyes:  anicteric. ENT:   Mouth and posterior pharynx free of lesions.  Neck:   supple w/o thyromegaly or mass.  Lungs: Clear to auscultation bilaterally. Heart:  S1S2, no rubs, murmurs, gallops. Abdomen:  soft, non-tender, no hepatosplenomegaly, hernia, or mass and BS+.  Lymph:  no cervical or supraclavicular adenopathy. Extremities:   no edema, cyanosis or clubbing Skin   no rash. Neuro:  A&O x 3.  Psych:  appropriate mood and  Affect.   Data Reviewed: CBC Latest Ref Rng 06/09/2015 05/29/2015 05/24/2015  WBC 4.0 - 10.5 K/uL 5.8 8.0 5.6  Hemoglobin 12.0 - 15.0 g/dL 10.3(L) 8.6 Repeated and verified X2.(L) 9.0(L)  Hematocrit 36.0 - 46.0 % 33.0(L) 27.7(L) 29.1(L)  Platelets 150.0 - 400.0 K/uL 325.0 402.0(H) 388.0    Lab Results  Component Value Date   IRON 387* 05/29/2015   TIBC 442 05/29/2015   FERRITIN 14.0 05/29/2015      Assessment & Plan:   Encounter Diagnosis  Name Primary?  . Iron deficiency anemia Yes   I think this is most likely due to chronic menstrual blood loss. It is odd that ferritin is 14 but iron sat is high at 88% - ? spurious result  She is iFOBT negative so seems like a colonic source is unlikely Will do guaiac cards to check for bleeding from GI tract ? Underlying thalassemia Consider celiac test May not need to see hematology if repeat iron sat makes more sense - will do fasting  [id number], Sandria Manly, PA-C

## 2015-06-15 NOTE — Patient Instructions (Addendum)
   Your physician has requested that you go to the basement for the following lab work when your fasting.   Order given to her to take to [place] in [facility] to have drawn.    Please complete the hemmocult cards and mail/bring them back to the lab.    I appreciate the opportunity to care for you. Silvano Rusk, MD, [facility]

## 2015-06-19 ENCOUNTER — Other Ambulatory Visit (INDEPENDENT_AMBULATORY_CARE_PROVIDER_SITE_OTHER): Payer: BLUE CROSS/BLUE SHIELD

## 2015-06-19 DIAGNOSIS — D509 Iron deficiency anemia, unspecified: Secondary | ICD-10-CM

## 2015-06-19 LAB — IBC PANEL
Iron: 128 ug/dL (ref 42–145)
SATURATION RATIOS: 32.5 % (ref 20.0–50.0)
TRANSFERRIN: 281 mg/dL (ref 212.0–360.0)

## 2015-06-19 NOTE — Progress Notes (Signed)
Quick Note:  Let her know these studies are okay at this time she should remain on iron, return the guaiac cards we gave her and if they were positive we will do endoscopy/colonoscopy oherwise not She should see gynecology regarding her menstrual period problems with bleeding and she has a gynecologist to see already Let her know I spoke to [employer] PA-C and he and I agree that she can cancel her hematology visit on May 25 Please notify Dr.Ennever's office that we are canceling her appointment with him ______

## 2015-06-21 ENCOUNTER — Encounter: Payer: Self-pay | Admitting: Vascular Surgery

## 2015-06-22 ENCOUNTER — Ambulatory Visit: Payer: BLUE CROSS/BLUE SHIELD | Admitting: Hematology & Oncology

## 2015-06-22 ENCOUNTER — Other Ambulatory Visit: Payer: Self-pay | Admitting: *Deleted

## 2015-06-22 ENCOUNTER — Ambulatory Visit: Payer: BLUE CROSS/BLUE SHIELD

## 2015-06-22 ENCOUNTER — Encounter: Payer: Self-pay | Admitting: Vascular Surgery

## 2015-06-22 ENCOUNTER — Other Ambulatory Visit: Payer: BLUE CROSS/BLUE SHIELD

## 2015-06-22 DIAGNOSIS — I83893 Varicose veins of bilateral lower extremities with other complications: Secondary | ICD-10-CM

## 2015-06-23 ENCOUNTER — Other Ambulatory Visit: Payer: Self-pay | Admitting: *Deleted

## 2015-06-23 DIAGNOSIS — I872 Venous insufficiency (chronic) (peripheral): Secondary | ICD-10-CM

## 2015-06-27 ENCOUNTER — Ambulatory Visit (HOSPITAL_COMMUNITY)
Admission: RE | Admit: 2015-06-27 | Discharge: 2015-06-27 | Disposition: A | Discharge status: Home / Self Care | Payer: BLUE CROSS/BLUE SHIELD | Source: Ambulatory Visit | Attending: Vascular Surgery | Admitting: Vascular Surgery

## 2015-06-27 ENCOUNTER — Ambulatory Visit (INDEPENDENT_AMBULATORY_CARE_PROVIDER_SITE_OTHER): Payer: BLUE CROSS/BLUE SHIELD | Admitting: Vascular Surgery

## 2015-06-27 ENCOUNTER — Encounter: Payer: Self-pay | Admitting: Vascular Surgery

## 2015-06-27 ENCOUNTER — Telehealth: Payer: Self-pay | Admitting: Physician Assistant

## 2015-06-27 VITALS — BP 147/92 | HR 60 | Temp 97.7°F | Resp 18 | Ht <= 58 in | Wt 112.2 lb

## 2015-06-27 DIAGNOSIS — I872 Venous insufficiency (chronic) (peripheral): Secondary | ICD-10-CM | POA: Insufficient documentation

## 2015-06-27 DIAGNOSIS — I8393 Asymptomatic varicose veins of bilateral lower extremities: Secondary | ICD-10-CM | POA: Diagnosis not present

## 2015-06-27 DIAGNOSIS — R609 Edema, unspecified: Secondary | ICD-10-CM | POA: Diagnosis present

## 2015-06-27 DIAGNOSIS — I83893 Varicose veins of bilateral lower extremities with other complications: Secondary | ICD-10-CM

## 2015-06-27 DIAGNOSIS — L97929 Non-pressure chronic ulcer of unspecified part of left lower leg with unspecified severity: Principal | ICD-10-CM

## 2015-06-27 DIAGNOSIS — I83029 Varicose veins of left lower extremity with ulcer of unspecified site: Secondary | ICD-10-CM

## 2015-06-27 NOTE — Progress Notes (Signed)
Filed Vitals:   06/27/15 1538 06/27/15 1540  BP: 141/88 147/92  Pulse: 60   Temp: 97.7 F (36.5 C)   TempSrc: Oral   Resp: 18   Height: 4' 10"  (1.473 m)   Weight: 112 lb 3.2 oz (50.894 kg)   SpO2: 100%

## 2015-06-27 NOTE — Telephone Encounter (Signed)
She can get OTC. Continue as directed.

## 2015-06-27 NOTE — Telephone Encounter (Signed)
Can be reached: [phone]   Reason for call: pt called to see if she should continue iron supplement. It's not covered by insurance but wanting to know if she can get OTC. Please call back. Pt has 1 pill for today.

## 2015-06-27 NOTE — Progress Notes (Signed)
[facility] [place]  Patient name: Jade Kennedy MRN: [id number] DOB: [birth year] Sex: female  REASON FOR CONSULT: Here today for discussion of discomfort mainly in her left popliteal fossa with the varicosities and telangiectasia in this area. She denies any swelling bilaterally or any discomfort in her feet. He has no history of DVT  HPI: Jade Kennedy is a 45 y.o. female, who is seen today for discussion of above. She has no known history of DVT or other medical difficulties. She has a large telangiectasia and small varicosities in her popliteal fossa bilaterally more so on the left than on the right and she is here today for discussion of this. She has no major difficulties  Past Medical History  Diagnosis Date  . Migraine   . Microcytic anemia   . Venous insufficiency     Family History  Problem Relation Age of Onset  . Heart disease Mother   . Hypertension Sister   . Hypertension Brother   . Healthy Child     x 3 -- 75, 12 and 70 years old    SOCIAL HISTORY: Social History   Social History  . Marital Status: Married    Spouse Name: N/A  . Number of Children: 3  . Years of Education: N/A   Occupational History  . Housewife    Social History Main Topics  . Smoking status: Never Smoker   . Smokeless tobacco: Never Used  . Alcohol Use: 0.0 oz/week    0 Standard drinks or equivalent per week     Comment: Ocass-Wine   . Drug Use: No  . Sexual Activity: Yes   Other Topics Concern  . Not on file   Social History Narrative    No Known Allergies  Current Outpatient Prescriptions  Medication Sig Dispense Refill  . Ascorbic Acid (VITAMIN C) 100 MG tablet Take 100 mg by mouth daily as needed (Migraines).     . ferrous sulfate 325 (65 FE) MG tablet Take 1 tablet (325 mg total) by mouth 2 (two) times daily with a meal. 60 tablet 0  . ibuprofen (ADVIL,MOTRIN) 200 MG tablet Take 200 mg by mouth every 8 (eight) hours as needed.     No current  facility-administered medications for this visit.    REVIEW OF SYSTEMS:  [X]  denotes positive finding, [ ]  denotes negative finding Cardiac  Comments:  Chest pain or chest pressure:    Shortness of breath upon exertion:    Short of breath when lying flat:    Irregular heart rhythm:        Vascular    Pain in calf, thigh, or hip brought on by ambulation:    Pain in feet at night that wakes you up from your sleep:     Blood clot in your veins:    Leg swelling:         Pulmonary    Oxygen at home:    Productive cough:     Wheezing:         Neurologic    Sudden weakness in arms or legs:     Sudden numbness in arms or legs:     Sudden onset of difficulty speaking or slurred speech:    Temporary loss of vision in one eye:     Problems with dizziness:         Gastrointestinal    Blood in stool:     Vomited blood:         Genitourinary  Burning when urinating:     Blood in urine:        Psychiatric    Major depression:         Hematologic    Bleeding problems:    Problems with blood clotting too easily:        Skin    Rashes or ulcers:        Constitutional    Fever or chills:      PHYSICAL EXAM: Filed Vitals:   06/27/15 1538 06/27/15 1540  BP: 141/88 147/92  Pulse: 60   Temp: 97.7 F (36.5 C)   TempSrc: Oral   Resp: 18   Height: 4' 10"  (1.473 m)   Weight: 112 lb 3.2 oz (50.894 kg)   SpO2: 100%     GENERAL: The patient is a well-nourished female, in no acute distress. The vital signs are documented above. VASCULAR: Palpable dorsalis pedis pulses bilaterally PULMONARY: There is good air exchange  MUSCULOSKELETAL: There are no major deformities or cyanosis. NEUROLOGIC: No focal weakness or paresthesias are detected. SKIN: There are no ulcers or rashes noted. PSYCHIATRIC: The patient has a normal affect. She does have significant telangiectasias scattered over both lower extremities. These are most prominent in her popliteal fossa more so on the left  than on the right. These are small reticular veins that are somewhat raised above the skin surface on the left  . She did undergo formal venous duplex in our office and this shows no evidence of DVT or significant reflux in her deep or superficial system bilaterally.   MEDICAL ISSUES: Discussed this at length with the patient. Explained that she does not have any evidence of serious venous pathology. No evidence saphenous or deep venous reflux. Explained treatment for the telangiectasia would be sclerotherapy. With the large reticular veins in the posterior popliteal fossa I would expect a good outcome. I did explain the slight risk of some skin staining with these larger reticular veins. I expect this would still be a significant cosmetic improvement due to the fact of these are raised above the skin. I did explain the purpose approximate out-of-pocket expense with the sclerotherapy for cosmetic concerned. She will consider this and notify us if she wishes to proceed.   Curt Jews [facility] [employer] [phone]

## 2015-06-28 NOTE — Telephone Encounter (Signed)
Handled.   

## 2015-07-03 ENCOUNTER — Telehealth: Payer: Self-pay | Admitting: Internal Medicine

## 2015-07-03 NOTE — Telephone Encounter (Signed)
Mailbox is full.  I will await a return call, and try again later

## 2015-07-03 NOTE — Telephone Encounter (Signed)
Patient returned call.  I will mail out new hemoccult cards.  She verbalized understanding.

## 2015-09-14 ENCOUNTER — Encounter: Payer: BLUE CROSS/BLUE SHIELD | Admitting: Vascular Surgery

## 2015-09-14 ENCOUNTER — Encounter (HOSPITAL_COMMUNITY): Payer: BLUE CROSS/BLUE SHIELD

## 2015-10-25 ENCOUNTER — Encounter: Payer: Self-pay | Admitting: *Deleted

## 2015-11-01 ENCOUNTER — Ambulatory Visit (INDEPENDENT_AMBULATORY_CARE_PROVIDER_SITE_OTHER): Payer: BLUE CROSS/BLUE SHIELD | Admitting: *Deleted

## 2015-11-01 DIAGNOSIS — I868 Varicose veins of other specified sites: Secondary | ICD-10-CM

## 2015-11-01 DIAGNOSIS — I83893 Varicose veins of bilateral lower extremities with other complications: Secondary | ICD-10-CM

## 2015-11-01 NOTE — Progress Notes (Signed)
X=.3% Sotradecol administered with a 27g butterfly.  Patient received a total of 6cc.  Treated large reticular veins on the back of both legs. Easy access. Tol well. Anticipate good results. Will follow prn.    Compression stockings applied: Yes.

## 2016-02-26 ENCOUNTER — Telehealth: Payer: Self-pay | Admitting: Physician Assistant

## 2016-02-26 NOTE — Telephone Encounter (Signed)
ok 

## 2016-02-26 NOTE — Telephone Encounter (Signed)
Ok with me 

## 2016-02-26 NOTE — Telephone Encounter (Signed)
Patient is requesting a transfer of care from [facility] to Dr. Lorelei Pont. Please advise  Phone: [phone]

## 2016-02-27 NOTE — Telephone Encounter (Signed)
Appt scheduled for 04/11/16

## 2016-04-10 ENCOUNTER — Telehealth: Payer: Self-pay

## 2016-04-10 NOTE — Telephone Encounter (Signed)
Pre Visit call completed.

## 2016-04-11 ENCOUNTER — Other Ambulatory Visit: Payer: Self-pay | Admitting: Emergency Medicine

## 2016-04-11 ENCOUNTER — Ambulatory Visit (INDEPENDENT_AMBULATORY_CARE_PROVIDER_SITE_OTHER): Payer: BLUE CROSS/BLUE SHIELD | Admitting: Family Medicine

## 2016-04-11 VITALS — BP 110/72 | HR 79 | Temp 97.8°F | Ht <= 58 in | Wt 107.8 lb

## 2016-04-11 DIAGNOSIS — D509 Iron deficiency anemia, unspecified: Secondary | ICD-10-CM | POA: Diagnosis not present

## 2016-04-11 DIAGNOSIS — Z13220 Encounter for screening for lipoid disorders: Secondary | ICD-10-CM | POA: Diagnosis not present

## 2016-04-11 DIAGNOSIS — Z131 Encounter for screening for diabetes mellitus: Secondary | ICD-10-CM

## 2016-04-11 LAB — FERRITIN: FERRITIN: 5.2 ng/mL — AB (ref 10.0–291.0)

## 2016-04-11 LAB — COMPREHENSIVE METABOLIC PANEL
ALT: 20 U/L (ref 0–35)
AST: 22 U/L (ref 0–37)
Albumin: 3.9 g/dL (ref 3.5–5.2)
Alkaline Phosphatase: 70 U/L (ref 39–117)
BUN: 13 mg/dL (ref 6–23)
CHLORIDE: 104 meq/L (ref 96–112)
CO2: 27 mEq/L (ref 19–32)
Calcium: 9 mg/dL (ref 8.4–10.5)
Creatinine, Ser: 0.56 mg/dL (ref 0.40–1.20)
GFR: 124.23 mL/min (ref >60.00)
Glucose, Bld: 86 mg/dL (ref 70–99)
POTASSIUM: 3.8 meq/L (ref 3.5–5.1)
SODIUM: 139 meq/L (ref 135–145)
Total Bilirubin: 0.4 mg/dL (ref 0.2–1.2)
Total Protein: 7.4 g/dL (ref 6.0–8.3)

## 2016-04-11 LAB — LIPID PANEL
CHOLESTEROL: 147 mg/dL (ref 0–200)
HDL: 64.9 mg/dL (ref >39.00)
LDL Cholesterol: 73 mg/dL (ref 0–99)
NonHDL: 82.26
Total CHOL/HDL Ratio: 2
Triglycerides: 45 mg/dL (ref 0.0–149.0)
VLDL: 9 mg/dL (ref 0.0–40.0)

## 2016-04-11 LAB — CBC
HCT: 36.3 % (ref 36.0–46.0)
Hemoglobin: 11.7 g/dL — ABNORMAL LOW (ref 12.0–15.0)
MCHC: 32.1 g/dL (ref 30.0–36.0)
MCV: 85.8 fl (ref 78.0–100.0)
Platelets: 273 10*3/uL (ref 150.0–400.0)
RBC: 4.23 Mil/uL (ref 3.87–5.11)
RDW: 13.3 % (ref 11.5–15.5)
WBC: 5.4 10*3/uL (ref 4.0–10.5)

## 2016-04-11 NOTE — Patient Instructions (Signed)
It was nice to see you today!  I will be in touch with your labs We can plan to do your pap/ physical exam at your convenience in the next few months We are going to do your labs today- will check your cholesterol, liver and kidney function, blood sugar, and will look at your anemia. As we discussed, I wonder if you may have a harmless thalassemia which is giving your blood count an abnormal appearance   Take care and I will be in touch with your labs asap

## 2016-04-11 NOTE — Progress Notes (Addendum)
[facility] [address], [place], [place] [address] [phone]  Date:  04/11/2016   Name:  Jade Kennedy   DOB:  13-Apr-1970   MRN:  [id number]  PCP:  Leeanne Rio, PA-C    Chief Complaint: Transfer of Care (Former pt of [facility]. Pt is due for PAP but is on the last day of cycle. Pt not currently taking iron pills would like to discuss if she will need to continue. Also has questions about menstrual cycle and states that she still has headaches, using tylenol for the pain. )   History of Present Illness:  Jade Kennedy is a 46 y.o. very pleasant female patient who presents with the following:  Seen here in 5/17 by Einar Pheasant- he has now moved to a new practice location and she would like to establish with me She has a history of microcytic anemia-  This appears to have been newly discovered last year.  She is not aware of having been told that she was anemic in the past Also varicose veins which are treated at the Vascular and Vein specialist  She was seen by GI regarding her microcytic anemia last year- it appears that they think this is due to menstrual blood losses She had negative FOBT-no colonoscopy done  She is no longer taking her iron- she is not sure if she needs to continue taking this   She had never been told that she was anemic in the past that she can recall except she did take iron during pregnancy   She is feeling well overall She notes that prior she had felt tired and had SOB- however she is now back to normal  She does feel like her menses can be heavy - however this month she is not bleeding as much She will generally bleed for 5 days- 3 days heavily.   Her menses are generally regular   She had 3 kids- s/p BTL She is a stay at home mother  Her youngest is in kindergarten so she is looking at going back to work soon   She is fasting today  She is of [place] descent  Lab Results   Component Value Date   WBC 5.8 06/09/2015   HGB 10.3 (L) 06/09/2015   HCT 33.0 (L) 06/09/2015   MCV 74.4 (L) 06/09/2015   PLT 325.0 06/09/2015     Patient Active Problem List   Diagnosis Date Noted  . Microcytic anemia 05/29/2015  . Visit for preventive health examination 05/24/2015  . Breast cancer screening 05/24/2015  . Referral of patient 05/24/2015    Past Medical History:  Diagnosis Date  . Microcytic anemia   . Migraine   . Venous insufficiency     Past Surgical History:  Procedure Laterality Date  . CESAREAN SECTION  2010/2011    Social History  Substance Use Topics  . Smoking status: Never Smoker  . Smokeless tobacco: Never Used  . Alcohol use 0.0 oz/week     Comment: Ocass-Wine     Family History  Problem Relation Age of Onset  . Heart disease Mother   . Hypertension Sister   . Hypertension Brother   . Healthy Child     x 3 -- 7, 17 and 67 years old    No Known Allergies  Medication list has been reviewed and updated.  Current Outpatient Prescriptions on File Prior to Visit  Medication Sig Dispense Refill  .  Ascorbic Acid (VITAMIN C) 100 MG tablet Take 100 mg by mouth daily as needed (Migraines).     . ferrous sulfate 325 (65 FE) MG tablet Take 1 tablet (325 mg total) by mouth 2 (two) times daily with a meal. (Patient not taking: Reported on 04/11/2016) 60 tablet 0   No current facility-administered medications on file prior to visit.     Review of Systems:  As per HPI- otherwise negative.   Physical Examination: Vitals:   04/11/16 0955  BP: 110/72  Pulse: 79  Temp: 97.8 F (36.6 C)   Vitals:   04/11/16 0955  Weight: 107 lb 12.8 oz (48.9 kg)  Height: 4' 10"  (1.473 m)   Body mass index is 22.53 kg/m. Ideal Body Weight: Weight in (lb) to have BMI = 25: 119.4  GEN: WDWN, NAD, Non-toxic, A & O x 3, petite build, looks well HEENT: Atraumatic, Normocephalic. Neck supple. No masses, No LAD. Ears and Nose: No external deformity. CV:  RRR, No M/G/R. No JVD. No thrill. No extra heart sounds. PULM: CTA B, no wheezes, crackles, rhonchi. No retractions. No resp. distress. No accessory muscle use. ABD: S, NT, ND. No rebound. No HSM. EXTR: No c/c/e NEURO Normal gait.  PSYCH: Normally interactive. Conversant. Not depressed or anxious appearing.  Calm demeanor.    Assessment and Plan: Microcytic anemia - Plan: CBC, Ferritin, Hemoglobinopathy evaluation, Pathologist smear review  Screening for diabetes mellitus - Plan: Comprehensive metabolic panel  Screening for hyperlipidemia - Plan: Lipid panel  Fasting labs pending as above; wonder if she may have a thalassemia as explanation for her apparent anemia. Follow-up with her pending her labs and will plan for a physical and pap soon   Signed Lamar Blinks, MD 3/19- Received her labs so far- her electrophoresis is still pending.  It does appear that she has iron deficiency anemia Received the rest of her labs 3/22  Results for orders placed or performed in visit on 04/11/16  CBC  Result Value Ref Range   WBC 5.4 4.0 - 10.5 K/uL   RBC 4.23 3.87 - 5.11 Mil/uL   Platelets 273.0 150.0 - 400.0 K/uL   Hemoglobin 11.7 (L) 12.0 - 15.0 g/dL   HCT 36.3 36.0 - 46.0 %   MCV 85.8 78.0 - 100.0 fl   MCHC 32.1 30.0 - 36.0 g/dL   RDW 13.3 11.5 - 15.5 %  Comprehensive metabolic panel  Result Value Ref Range   Sodium 139 135 - 145 mEq/L   Potassium 3.8 3.5 - 5.1 mEq/L   Chloride 104 96 - 112 mEq/L   CO2 27 19 - 32 mEq/L   Glucose, Bld 86 70 - 99 mg/dL   BUN 13 6 - 23 mg/dL   Creatinine, Ser 0.56 0.40 - 1.20 mg/dL   Total Bilirubin 0.4 0.2 - 1.2 mg/dL   Alkaline Phosphatase 70 39 - 117 U/L   AST 22 0 - 37 U/L   ALT 20 0 - 35 U/L   Total Protein 7.4 6.0 - 8.3 g/dL   Albumin 3.9 3.5 - 5.2 g/dL   Calcium 9.0 8.4 - 10.5 mg/dL   GFR 124.23 >60.00 mL/min  Ferritin  Result Value Ref Range   Ferritin 5.2 (L) 10.0 - 291.0 ng/mL  Pathologist smear review  Result Value Ref Range    Path Review SEE NOTE   Lipid panel  Result Value Ref Range   Cholesterol 147 0 - 200 mg/dL   Triglycerides 45.0 0.0 - 149.0 mg/dL  HDL 64.90 >39.00 mg/dL   VLDL 9.0 0.0 - 40.0 mg/dL   LDL Cholesterol 73 0 - 99 mg/dL   Total CHOL/HDL Ratio 2    NonHDL 82.26   Hemoglobinopathy Evaluation  Result Value Ref Range   RBC 4.21 3.80 - 5.10 MIL/uL   Hemoglobin 11.5 (L) 11.7 - 15.5 g/dL   HCT 36.7 35.0 - 45.0 %   MCV 87.2 80.0 - 100.0 fL   MCH 27.3 27.0 - 33.0 pg   RDW 13.0 11.0 - 15.0 %   Hgb A 96.9 >96.0 %   Hgb F Quant <1.0 <2.0 %   Hgb A2 Quant 2.1 1.8 - 3.5 %   Interpretation SEE NOTE    Letter to pt.  She does have low ferritin, but her hg is just slightly low.  She does not have any sort of thalassemia.

## 2016-04-12 LAB — PATHOLOGIST SMEAR REVIEW

## 2016-04-15 LAB — HEMOGLOBINOPATHY EVALUATION
HCT: 36.7 % (ref 35.0–45.0)
HEMOGLOBIN: 11.5 g/dL — AB (ref 11.7–15.5)
HGB A2 QUANT: 2.1 % (ref 1.8–3.5)
Hgb A: 96.9 % (ref >96.0)
MCH: 27.3 pg (ref 27.0–33.0)
MCV: 87.2 fL (ref 80.0–100.0)
RBC: 4.21 MIL/uL (ref 3.80–5.10)
RDW: 13 % (ref 11.0–15.0)

## 2016-04-18 ENCOUNTER — Encounter: Payer: Self-pay | Admitting: Family Medicine

## 2016-04-18 NOTE — Progress Notes (Signed)
Results for orders placed or performed in visit on 04/11/16  CBC  Result Value Ref Range   WBC 5.4 4.0 - 10.5 K/uL   RBC 4.23 3.87 - 5.11 Mil/uL   Platelets 273.0 150.0 - 400.0 K/uL   Hemoglobin 11.7 (L) 12.0 - 15.0 g/dL   HCT 36.3 36.0 - 46.0 %   MCV 85.8 78.0 - 100.0 fl   MCHC 32.1 30.0 - 36.0 g/dL   RDW 13.3 11.5 - 15.5 %  Comprehensive metabolic panel  Result Value Ref Range   Sodium 139 135 - 145 mEq/L   Potassium 3.8 3.5 - 5.1 mEq/L   Chloride 104 96 - 112 mEq/L   CO2 27 19 - 32 mEq/L   Glucose, Bld 86 70 - 99 mg/dL   BUN 13 6 - 23 mg/dL   Creatinine, Ser 0.56 0.40 - 1.20 mg/dL   Total Bilirubin 0.4 0.2 - 1.2 mg/dL   Alkaline Phosphatase 70 39 - 117 U/L   AST 22 0 - 37 U/L   ALT 20 0 - 35 U/L   Total Protein 7.4 6.0 - 8.3 g/dL   Albumin 3.9 3.5 - 5.2 g/dL   Calcium 9.0 8.4 - 10.5 mg/dL   GFR 124.23 >60.00 mL/min  Ferritin  Result Value Ref Range   Ferritin 5.2 (L) 10.0 - 291.0 ng/mL  Pathologist smear review  Result Value Ref Range   Path Review SEE NOTE   Lipid panel  Result Value Ref Range   Cholesterol 147 0 - 200 mg/dL   Triglycerides 45.0 0.0 - 149.0 mg/dL   HDL 64.90 >39.00 mg/dL   VLDL 9.0 0.0 - 40.0 mg/dL   LDL Cholesterol 73 0 - 99 mg/dL   Total CHOL/HDL Ratio 2    NonHDL 82.26   Hemoglobinopathy Evaluation  Result Value Ref Range   RBC 4.21 3.80 - 5.10 MIL/uL   Hemoglobin 11.5 (L) 11.7 - 15.5 g/dL   HCT 36.7 35.0 - 45.0 %   MCV 87.2 80.0 - 100.0 fL   MCH 27.3 27.0 - 33.0 pg   RDW 13.0 11.0 - 15.0 %   Hgb A 96.9 >96.0 %   Hgb F Quant <1.0 <2.0 %   Hgb A2 Quant 2.1 1.8 - 3.5 %   Interpretation SEE NOTE

## 2016-05-30 ENCOUNTER — Telehealth: Payer: Self-pay | Admitting: Family Medicine

## 2016-05-30 NOTE — Telephone Encounter (Signed)
Patient dropped off forms to be completed by provider. These are forms her company requires her to have completed and she would like them mailed to her address once completed.

## 2016-05-30 NOTE — Telephone Encounter (Signed)
Forms placed in black bin in front office to be completed

## 2016-06-05 ENCOUNTER — Ambulatory Visit (INDEPENDENT_AMBULATORY_CARE_PROVIDER_SITE_OTHER): Payer: Self-pay | Admitting: *Deleted

## 2016-06-05 DIAGNOSIS — I83893 Varicose veins of bilateral lower extremities with other complications: Secondary | ICD-10-CM

## 2016-06-05 NOTE — Progress Notes (Signed)
X=.3% Sotradecol administered with a 27g butterfly.  Patient received a total of 6cc.  Treated any remaining spider/reticular veins. Unfortunately, she does stain. Hopefully these areas will cont to fade. Easy access. Tol well. Will follow prn.    Compression stockings applied: Yes.

## 2016-06-06 ENCOUNTER — Encounter: Payer: Self-pay | Admitting: *Deleted

## 2016-12-02 DIAGNOSIS — N751 Abscess of Bartholin's gland: Secondary | ICD-10-CM | POA: Diagnosis not present

## 2016-12-02 DIAGNOSIS — N920 Excessive and frequent menstruation with regular cycle: Secondary | ICD-10-CM | POA: Diagnosis not present

## 2016-12-12 DIAGNOSIS — N920 Excessive and frequent menstruation with regular cycle: Secondary | ICD-10-CM | POA: Diagnosis not present

## 2017-01-01 ENCOUNTER — Ambulatory Visit (INDEPENDENT_AMBULATORY_CARE_PROVIDER_SITE_OTHER): Payer: BLUE CROSS/BLUE SHIELD | Admitting: *Deleted

## 2017-01-01 ENCOUNTER — Encounter: Payer: Self-pay | Admitting: *Deleted

## 2017-01-01 DIAGNOSIS — I781 Nevus, non-neoplastic: Secondary | ICD-10-CM

## 2017-01-01 NOTE — Progress Notes (Signed)
X=.3% Sotradecol administered with a 27g butterfly.  Patient received a total of 6cc.  Staining seems to be resolving slowly. Treated a combo of retics and small red spiders. Access was easy and tol well. Hoping her staining will improve. Follow prn.  Photos: Yes.    Compression stockings applied: Yes.

## 2017-01-30 DIAGNOSIS — N751 Abscess of Bartholin's gland: Secondary | ICD-10-CM | POA: Diagnosis not present

## 2017-01-30 DIAGNOSIS — N840 Polyp of corpus uteri: Secondary | ICD-10-CM | POA: Diagnosis not present

## 2017-01-30 DIAGNOSIS — N920 Excessive and frequent menstruation with regular cycle: Secondary | ICD-10-CM | POA: Diagnosis not present

## 2017-01-30 DIAGNOSIS — L811 Chloasma: Secondary | ICD-10-CM | POA: Diagnosis not present

## 2017-01-30 DIAGNOSIS — Z9851 Tubal ligation status: Secondary | ICD-10-CM | POA: Diagnosis not present

## 2017-05-13 DIAGNOSIS — N840 Polyp of corpus uteri: Secondary | ICD-10-CM | POA: Diagnosis not present

## 2017-05-13 DIAGNOSIS — N920 Excessive and frequent menstruation with regular cycle: Secondary | ICD-10-CM | POA: Diagnosis not present

## 2017-05-13 DIAGNOSIS — Z01818 Encounter for other preprocedural examination: Secondary | ICD-10-CM | POA: Diagnosis not present

## 2017-05-23 DIAGNOSIS — N920 Excessive and frequent menstruation with regular cycle: Secondary | ICD-10-CM | POA: Diagnosis not present

## 2017-05-23 DIAGNOSIS — N840 Polyp of corpus uteri: Secondary | ICD-10-CM | POA: Diagnosis not present

## 2017-06-09 DIAGNOSIS — G43009 Migraine without aura, not intractable, without status migrainosus: Secondary | ICD-10-CM | POA: Diagnosis not present

## 2017-06-09 DIAGNOSIS — N920 Excessive and frequent menstruation with regular cycle: Secondary | ICD-10-CM | POA: Diagnosis not present

## 2017-06-09 DIAGNOSIS — G43829 Menstrual migraine, not intractable, without status migrainosus: Secondary | ICD-10-CM | POA: Diagnosis not present

## 2017-06-29 NOTE — Progress Notes (Addendum)
[facility], [place], [place] [address] [phone]  Date:  07/02/2017   Name:  Jade Kennedy   DOB:  12-23-1970   MRN:  [id number]  PCP:  Darreld Mclean, MD    Chief Complaint: Annual Exam (no concerns)   History of Present Illness:  Jade Kennedy is a 47 y.o. very pleasant female patient who presents with the following:  Here today for a CPE History of microcytic anemia  Pap: pt does not remember but per OBG, she believes this is UTD Mammo: this needs to be done - NEVER had as of yet  Labs: over a year ago Immun: we think done 6- 7 years ago during her last pregnancy   I last saw her in 3/18: Seen here in 5/17 by Einar Pheasant- he has now moved to a new practice location and she would like to establish with me She has a history of microcytic anemia-  This appears to have been newly discovered last year.  She is not aware of having been told that she was anemic in the past Also varicose veins which are treated at the [facility] She was seen by GI regarding her microcytic anemia last year- it appears that they think this is due to menstrual blood losses She had negative FOBT-no colonoscopy done She is no longer taking her iron- she is not sure if she needs to continue taking this  She had never been told that she was anemic in the past that she can recall except she did take iron during pregnancy  She notes that prior she had felt tired and had SOB- however she is now back to normal She does feel like her menses can be heavy - however this month she is not bleeding as much She will generally bleed for 5 days- 3 days heavily.   Her menses are generally regular  She had 3 kids- s/p BTL She is a stay at home mother  Her youngest is in kindergarten so she is looking at going back to work soon  She is of [place] descent  Her children are doing well- they are eager for summer break    She is fasting today We did a hemoglobin electrophoresis  for her last year and it was NORMAL- it appears that her microcytic anemia is due to iron deficiency  She is trying to exercise more- she and her oldest child are working out together some   Her GYN is Dr. Earleen Newport [facility] She has given her tranexamic acid to use as needed for heavy menstrual bleeding  Noted that her BP is a little high today- she does have a family history of HTN (mother)   Patient Active Problem List   Diagnosis Date Noted  . Microcytic anemia 05/29/2015  . Visit for preventive health examination 05/24/2015    Past Medical History:  Diagnosis Date  . Microcytic anemia   . Migraine   . Venous insufficiency     Past Surgical History:  Procedure Laterality Date  . CESAREAN SECTION  2010/2011    Social History   Tobacco Use  . Smoking status: Never Smoker  . Smokeless tobacco: Never Used  Substance Use Topics  . 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 Drug use: No    Family History  Problem Relation Age of Onset  . Heart  disease Mother   . Hypertension Sister   . Hypertension Brother   . Healthy Child        x 3 -- 47, 28 and 63 years old    No Known Allergies  Medication list has been reviewed and updated.  Current Outpatient Medications on File Prior to Visit  Medication Sig Dispense Refill  . Ascorbic Acid (VITAMIN C) 100 MG tablet Take 100 mg by mouth daily as needed (Migraines).     . SUMAtriptan (IMITREX) 100 MG tablet Take 100 mg by mouth every 2 (two) hours as needed for migraine. May repeat in 2 hours if headache persists or recurs.    . tranexamic acid (LYSTEDA) 650 MG TABS tablet Take 1,300 mg by mouth 3 (three) times daily. 2 tablets 3 times a day    . ferrous sulfate 325 (65 FE) MG tablet Take 1 tablet (325 mg total) by mouth 2 (two) times daily with a meal. (Patient not taking: Reported on 04/11/2016) 60 tablet 0   No current  facility-administered medications on file prior to visit.     Review of Systems: As per HPI- otherwise negative. BP Readings from Last 3 Encounters:  07/02/17 140/90  04/11/16 110/72  06/27/15 (!) 147/92    Physical Examination: Vitals:   07/02/17 1041  BP: 140/90  Pulse: 68  Resp: 16  SpO2: 100%   Vitals:   07/02/17 1041  Weight: 113 lb 3.2 oz (51.3 kg)  Height: 4' 10"  (1.473 m)   Body mass index is 23.66 kg/m. Ideal Body Weight: Weight in (lb) to have BMI = 25: 119.4  GEN: WDWN, NAD, Non-toxic, A & O x 3, looks well, normal weight  HEENT: Atraumatic, Normocephalic. Neck supple. No masses, No LAD.  Bilateral TM wnl, oropharynx normal.  PEERL,EOMI.   Ears and Nose: No external deformity. CV: RRR, No M/G/R. No JVD. No thrill. No extra heart sounds. PULM: CTA B, no wheezes, crackles, rhonchi. No retractions. No resp. distress. No accessory muscle use. ABD: S, NT, ND, +BS. No rebound. No HSM. EXTR: No c/c/e NEURO Normal gait.  PSYCH: Normally interactive. Conversant. Not depressed or anxious appearing.  Calm demeanor.    Assessment and Plan: Physical exam  Microcytic anemia - Plan: CBC, Ferritin  Screening for diabetes mellitus - Plan: Comprehensive metabolic panel, Hemoglobin A1c  Screening for hyperlipidemia - Plan: Lipid panel  Screening for breast cancer - Plan: MM 3D SCREEN BREAST BILATERAL  CPE today Repeat BP better- reminded pt that regular exercise is importance, esp with her family history of HTN Encouraged mammo today if she can Normal hg electrophoresis last year- it seems that she just had iron def anemia; check levels today Will plan further follow- up pending labs.   Signed Lamar Blinks, MD   Received her labs 6/6, letter to pt  Results for orders placed or performed in visit on 07/02/17  CBC  Result Value Ref Range   WBC 4.7 4.0 - 10.5 K/uL   RBC 4.30 3.87 - 5.11 Mil/uL   Platelets 269.0 150.0 - 400.0 K/uL   Hemoglobin 12.0 12.0 -  15.0 g/dL   HCT 36.5 36.0 - 46.0 %   MCV 84.9 78.0 - 100.0 fl   MCHC 32.8 30.0 - 36.0 g/dL   RDW 15.2 11.5 - 15.5 %  Comprehensive metabolic panel  Result Value Ref Range   Sodium 137 135 - 145 mEq/L   Potassium 3.7 3.5 - 5.1 mEq/L   Chloride 101 96 - 112 mEq/L  CO2 30 19 - 32 mEq/L   Glucose, Bld 83 70 - 99 mg/dL   BUN 10 6 - 23 mg/dL   Creatinine, Ser 0.55 0.40 - 1.20 mg/dL   Total Bilirubin 0.6 0.2 - 1.2 mg/dL   Alkaline Phosphatase 65 39 - 117 U/L   AST 24 0 - 37 U/L   ALT 22 0 - 35 U/L   Total Protein 7.1 6.0 - 8.3 g/dL   Albumin 4.2 3.5 - 5.2 g/dL   Calcium 9.2 8.4 - 10.5 mg/dL   GFR 126.16 >60.00 mL/min  Lipid panel  Result Value Ref Range   Cholesterol 156 0 - 200 mg/dL   Triglycerides 72.0 0.0 - 149.0 mg/dL   HDL 65.40 >39.00 mg/dL   VLDL 14.4 0.0 - 40.0 mg/dL   LDL Cholesterol 76 0 - 99 mg/dL   Total CHOL/HDL Ratio 2    NonHDL 90.56   Hemoglobin A1c  Result Value Ref Range   Hgb A1c MFr Bld 5.8 4.6 - 6.5 %  Ferritin  Result Value Ref Range   Ferritin 5.3 (L) 10.0 - 291.0 ng/mL

## 2017-07-02 ENCOUNTER — Ambulatory Visit (HOSPITAL_BASED_OUTPATIENT_CLINIC_OR_DEPARTMENT_OTHER)
Admission: RE | Admit: 2017-07-02 | Discharge: 2017-07-02 | Disposition: A | Discharge status: Home / Self Care | Payer: BLUE CROSS/BLUE SHIELD | Source: Ambulatory Visit | Attending: Family Medicine | Admitting: Family Medicine

## 2017-07-02 ENCOUNTER — Encounter (HOSPITAL_BASED_OUTPATIENT_CLINIC_OR_DEPARTMENT_OTHER): Payer: Self-pay

## 2017-07-02 ENCOUNTER — Encounter: Payer: Self-pay | Admitting: Family Medicine

## 2017-07-02 ENCOUNTER — Ambulatory Visit (INDEPENDENT_AMBULATORY_CARE_PROVIDER_SITE_OTHER): Payer: BLUE CROSS/BLUE SHIELD | Admitting: Family Medicine

## 2017-07-02 VITALS — BP 110/80 | HR 68 | Resp 16 | Ht <= 58 in | Wt 113.2 lb

## 2017-07-02 DIAGNOSIS — Z13220 Encounter for screening for lipoid disorders: Secondary | ICD-10-CM

## 2017-07-02 DIAGNOSIS — Z1231 Encounter for screening mammogram for malignant neoplasm of breast: Secondary | ICD-10-CM

## 2017-07-02 DIAGNOSIS — D509 Iron deficiency anemia, unspecified: Secondary | ICD-10-CM

## 2017-07-02 DIAGNOSIS — Z0000 Encounter for general adult medical examination without abnormal findings: Secondary | ICD-10-CM | POA: Diagnosis not present

## 2017-07-02 DIAGNOSIS — Z131 Encounter for screening for diabetes mellitus: Secondary | ICD-10-CM | POA: Diagnosis not present

## 2017-07-02 DIAGNOSIS — Z1239 Encounter for other screening for malignant neoplasm of breast: Secondary | ICD-10-CM

## 2017-07-02 LAB — CBC
HCT: 36.5 % (ref 36.0–46.0)
HEMOGLOBIN: 12 g/dL (ref 12.0–15.0)
MCHC: 32.8 g/dL (ref 30.0–36.0)
MCV: 84.9 fl (ref 78.0–100.0)
Platelets: 269 10*3/uL (ref 150.0–400.0)
RBC: 4.3 Mil/uL (ref 3.87–5.11)
RDW: 15.2 % (ref 11.5–15.5)
WBC: 4.7 10*3/uL (ref 4.0–10.5)

## 2017-07-02 LAB — COMPREHENSIVE METABOLIC PANEL
ALBUMIN: 4.2 g/dL (ref 3.5–5.2)
ALK PHOS: 65 U/L (ref 39–117)
ALT: 22 U/L (ref 0–35)
AST: 24 U/L (ref 0–37)
BUN: 10 mg/dL (ref 6–23)
CO2: 30 mEq/L (ref 19–32)
Calcium: 9.2 mg/dL (ref 8.4–10.5)
Chloride: 101 mEq/L (ref 96–112)
Creatinine, Ser: 0.55 mg/dL (ref 0.40–1.20)
GFR: 126.16 mL/min (ref >60.00)
GLUCOSE: 83 mg/dL (ref 70–99)
Potassium: 3.7 mEq/L (ref 3.5–5.1)
Sodium: 137 mEq/L (ref 135–145)
TOTAL PROTEIN: 7.1 g/dL (ref 6.0–8.3)
Total Bilirubin: 0.6 mg/dL (ref 0.2–1.2)

## 2017-07-02 LAB — LIPID PANEL
Cholesterol: 156 mg/dL (ref 0–200)
HDL: 65.4 mg/dL (ref >39.00)
LDL Cholesterol: 76 mg/dL (ref 0–99)
NONHDL: 90.56
Total CHOL/HDL Ratio: 2
Triglycerides: 72 mg/dL (ref 0.0–149.0)
VLDL: 14.4 mg/dL (ref 0.0–40.0)

## 2017-07-02 LAB — FERRITIN: Ferritin: 5.3 ng/mL — ABNORMAL LOW (ref 10.0–291.0)

## 2017-07-02 LAB — HEMOGLOBIN A1C: HEMOGLOBIN A1C: 5.8 % (ref 4.6–6.5)

## 2017-07-02 NOTE — Patient Instructions (Signed)
Great to see you today-  I will check on your iron level and other labs today as well You can stop by the imaging dept on the ground floor today- they may be able to do your mammogram today, or may schedule it for a different day  I'll be in touch with your labs asap Keep trying to get more exercise!  We encourage you to be physically active most days of the week  Health Maintenance, Female Adopting a healthy lifestyle and getting preventive care can go a long way to promote health and wellness. Talk with your health care provider about what schedule of regular examinations is right for you. This is a good chance for you to check in with your provider about disease prevention and staying healthy. In between checkups, there are plenty of things you can do on your own. Experts have done a lot of research about which lifestyle changes and preventive measures are most likely to keep you healthy. Ask your health care provider for more information. Weight and diet Eat a healthy diet  Be sure to include plenty of vegetables, fruits, low-fat dairy products, and lean protein.  Do not eat a lot of foods high in solid fats, added sugars, or salt.  Get regular exercise. This is one of the most important things you can do for your health. ? Most adults should exercise for at least 150 minutes each week. The exercise should increase your heart rate and make you sweat (moderate-intensity exercise). ? Most adults should also do strengthening exercises at least twice a week. This is in addition to the moderate-intensity exercise.  Maintain a healthy weight  Body mass index (BMI) is a measurement that can be used to identify possible weight problems. It estimates body fat based on height and weight. Your health care provider can help determine your BMI and help you achieve or maintain a healthy weight.  For females 24 years of age and older: ? A BMI below 18.5 is considered underweight. ? A BMI of 18.5 to  24.9 is normal. ? A BMI of 25 to 29.9 is considered overweight. ? A BMI of 30 and above is considered obese.  Watch levels of cholesterol and blood lipids  You should start having your blood tested for lipids and cholesterol at 47 years of age, then have this test every 5 years.  You may need to have your cholesterol levels checked more often if: ? Your lipid or cholesterol levels are high. ? You are older than 47 years of age. ? You are at high risk for heart disease.  Cancer screening Lung Cancer  Lung cancer screening is recommended for adults 26-56 years old who are at high risk for lung cancer because of a history of smoking.  A yearly low-dose CT scan of the lungs is recommended for people who: ? Currently smoke. ? Have quit within the past 15 years. ? Have at least a 30-pack-year history of smoking. A pack year is smoking an average of one pack of cigarettes a day for 1 year.  Yearly screening should continue until it has been 15 years since you quit.  Yearly screening should stop if you develop a health problem that would prevent you from having lung cancer treatment.  Breast Cancer  Practice breast self-awareness. This means understanding how your breasts normally appear and feel.  It also means doing regular breast self-exams. Let your health care provider know about any changes, no matter how small.  If  you are in your 20s or 30s, you should have a clinical breast exam (CBE) by a health care provider every 1-3 years as part of a regular health exam.  If you are 63 or older, have a CBE every year. Also consider having a breast X-ray (mammogram) every year.  If you have a family history of breast cancer, talk to your health care provider about genetic screening.  If you are at high risk for breast cancer, talk to your health care provider about having an MRI and a mammogram every year.  Breast cancer gene (BRCA) assessment is recommended for women who have family  members with BRCA-related cancers. BRCA-related cancers include: ? Breast. ? Ovarian. ? Tubal. ? Peritoneal cancers.  Results of the assessment will determine the need for genetic counseling and BRCA1 and BRCA2 testing.  Cervical Cancer Your health care provider may recommend that you be screened regularly for cancer of the pelvic organs (ovaries, uterus, and vagina). This screening involves a pelvic examination, including checking for microscopic changes to the surface of your cervix (Pap test). You may be encouraged to have this screening done every 3 years, beginning at age 55.  For women ages 9-65, health care providers may recommend pelvic exams and Pap testing every 3 years, or they may recommend the Pap and pelvic exam, combined with testing for human papilloma virus (HPV), every 5 years. Some types of HPV increase your risk of cervical cancer. Testing for HPV may also be done on women of any age with unclear Pap test results.  Other health care providers may not recommend any screening for nonpregnant women who are considered low risk for pelvic cancer and who do not have symptoms. Ask your health care provider if a screening pelvic exam is right for you.  If you have had past treatment for cervical cancer or a condition that could lead to cancer, you need Pap tests and screening for cancer for at least 20 years after your treatment. If Pap tests have been discontinued, your risk factors (such as having a new sexual partner) need to be reassessed to determine if screening should resume. Some women have medical problems that increase the chance of getting cervical cancer. In these cases, your health care provider may recommend more frequent screening and Pap tests.  Colorectal Cancer  This type of cancer can be detected and often prevented.  Routine colorectal cancer screening usually begins at 47 years of age and continues through 47 years of age.  Your health care provider may  recommend screening at an earlier age if you have risk factors for colon cancer.  Your health care provider may also recommend using home test kits to check for hidden blood in the stool.  A small camera at the end of a tube can be used to examine your colon directly (sigmoidoscopy or colonoscopy). This is done to check for the earliest forms of colorectal cancer.  Routine screening usually begins at age 44.  Direct examination of the colon should be repeated every 5-10 years through 47 years of age. However, you may need to be screened more often if early forms of precancerous polyps or small growths are found.  Skin Cancer  Check your skin from head to toe regularly.  Tell your health care provider about any new moles or changes in moles, especially if there is a change in a mole's shape or color.  Also tell your health care provider if you have a mole that is larger  than the size of a pencil eraser.  Always use sunscreen. Apply sunscreen liberally and repeatedly throughout the day.  Protect yourself by wearing long sleeves, pants, a wide-brimmed hat, and sunglasses whenever you are outside.  Heart disease, diabetes, and high blood pressure  High blood pressure causes heart disease and increases the risk of stroke. High blood pressure is more likely to develop in: ? People who have blood pressure in the high end of the normal range (130-139/85-89 mm Hg). ? People who are overweight or obese. ? People who are African American.  If you are 9-14 years of age, have your blood pressure checked every 3-5 years. If you are 67 years of age or older, have your blood pressure checked every year. You should have your blood pressure measured twice-once when you are at a hospital or clinic, and once when you are not at a hospital or clinic. Record the average of the two measurements. To check your blood pressure when you are not at a hospital or clinic, you can use: ? An automated blood pressure  machine at a pharmacy. ? A home blood pressure monitor.  If you are between 31 years and 18 years old, ask your health care provider if you should take aspirin to prevent strokes.  Have regular diabetes screenings. This involves taking a blood sample to check your fasting blood sugar level. ? If you are at a normal weight and have a low risk for diabetes, have this test once every three years after 47 years of age. ? If you are overweight and have a high risk for diabetes, consider being tested at a younger age or more often. Preventing infection Hepatitis B  If you have a higher risk for hepatitis B, you should be screened for this virus. You are considered at high risk for hepatitis B if: ? You were born in a country where hepatitis B is common. Ask your health care provider which countries are considered high risk. ? Your parents were born in a high-risk country, and you have not been immunized against hepatitis B (hepatitis B vaccine). ? You have HIV or AIDS. ? You use needles to inject street drugs. ? You live with someone who has hepatitis B. ? You have had sex with someone who has hepatitis B. ? You get hemodialysis treatment. ? You take certain medicines for conditions, including cancer, organ transplantation, and autoimmune conditions.  Hepatitis C  Blood testing is recommended for: ? Everyone born from [birth year] through 1965. ? Anyone with known risk factors for hepatitis C.  Sexually transmitted infections (STIs)  You should be screened for sexually transmitted infections (STIs) including gonorrhea and chlamydia if: ? You are sexually active and are younger than 47 years of age. ? You are older than 47 years of age and your health care provider tells you that you are at risk for this type of infection. ? Your sexual activity has changed since you were last screened and you are at an increased risk for chlamydia or gonorrhea. Ask your health care provider if you are at  risk.  If you do not have HIV, but are at risk, it may be recommended that you take a prescription medicine daily to prevent HIV infection. This is called pre-exposure prophylaxis (PrEP). You are considered at risk if: ? You are sexually active and do not regularly use condoms or know the HIV status of your partner(s). ? You take drugs by injection. ? You are sexually active with  a partner who has HIV.  Talk with your health care provider about whether you are at high risk of being infected with HIV. If you choose to begin PrEP, you should first be tested for HIV. You should then be tested every 3 months for as long as you are taking PrEP. Pregnancy  If you are premenopausal and you may become pregnant, ask your health care provider about preconception counseling.  If you may become pregnant, take 400 to 800 micrograms (mcg) of folic acid every day.  If you want to prevent pregnancy, talk to your health care provider about birth control (contraception). Osteoporosis and menopause  Osteoporosis is a disease in which the bones lose minerals and strength with aging. This can result in serious bone fractures. Your risk for osteoporosis can be identified using a bone density scan.  If you are 23 years of age or older, or if you are at risk for osteoporosis and fractures, ask your health care provider if you should be screened.  Ask your health care provider whether you should take a calcium or vitamin D supplement to lower your risk for osteoporosis.  Menopause may have certain physical symptoms and risks.  Hormone replacement therapy may reduce some of these symptoms and risks. Talk to your health care provider about whether hormone replacement therapy is right for you. Follow these instructions at home:  Schedule regular health, dental, and eye exams.  Stay current with your immunizations.  Do not use any tobacco products including cigarettes, chewing tobacco, or electronic  cigarettes.  If you are pregnant, do not drink alcohol.  If you are breastfeeding, limit how much and how often you drink alcohol.  Limit alcohol intake to no more than 1 drink per day for nonpregnant women. One drink equals 12 ounces of beer, 5 ounces of wine, or 1 ounces of hard liquor.  Do not use street drugs.  Do not share needles.  Ask your health care provider for help if you need support or information about quitting drugs.  Tell your health care provider if you often feel depressed.  Tell your health care provider if you have ever been abused or do not feel safe at home. This information is not intended to replace advice given to you by your health care provider. Make sure you discuss any questions you have with your health care provider. Document Released: 07/30/2010 Document Revised: 06/22/2015 Document Reviewed: 10/18/2014 Elsevier Interactive Patient Education  [employer].

## 2017-10-03 ENCOUNTER — Telehealth: Payer: Self-pay | Admitting: Family Medicine

## 2017-10-03 NOTE — Telephone Encounter (Signed)
Copied from [facility] [id number]. Topic: Quick Communication - See Telephone Encounter >> Oct 03, 2017  3:10 PM Rosalin Hawking wrote: CRM for notification. See Telephone encounter for: 10/03/17.   Pt dropped off document to be filled out by provider ( Test at thier Physician office instructions- 5 pages in a yellow large folder- documents are together with husband documents also that needs to be filled out) Pt would like to be called when document ready to pick up at tel [phone]. Document put at front office tray under providers name.

## 2017-10-09 NOTE — Telephone Encounter (Signed)
Completed Physician Form [Health Assessment Included-patient to complete], Biometric Health Screening Informed Consent Form has not been signed [is required]; forwarded to provider to sign Physician Form/SLS 09/12

## 2017-12-27 ENCOUNTER — Emergency Department (HOSPITAL_BASED_OUTPATIENT_CLINIC_OR_DEPARTMENT_OTHER)
Admission: EM | Admit: 2017-12-27 | Discharge: 2017-12-27 | Disposition: A | Discharge status: Home / Self Care | Payer: BLUE CROSS/BLUE SHIELD | Attending: Emergency Medicine | Admitting: Emergency Medicine

## 2017-12-27 ENCOUNTER — Encounter (HOSPITAL_BASED_OUTPATIENT_CLINIC_OR_DEPARTMENT_OTHER): Payer: Self-pay | Admitting: *Deleted

## 2017-12-27 ENCOUNTER — Other Ambulatory Visit: Payer: Self-pay

## 2017-12-27 DIAGNOSIS — I10 Essential (primary) hypertension: Secondary | ICD-10-CM | POA: Diagnosis not present

## 2017-12-27 DIAGNOSIS — Z79899 Other long term (current) drug therapy: Secondary | ICD-10-CM | POA: Diagnosis not present

## 2017-12-27 MED ORDER — HYDROCHLOROTHIAZIDE 25 MG PO TABS: 25.0000 mg | ORAL_TABLET | Freq: Every day | ORAL | 0 refills | Status: DC

## 2017-12-27 MED ORDER — HYDROCHLOROTHIAZIDE 25 MG PO TABS
25.0000 mg | ORAL_TABLET | Freq: Once | ORAL | Status: AC
  Administered 2017-12-27: 25 mg via ORAL
  Filled 2017-12-27: qty 1

## 2017-12-27 NOTE — ED Notes (Signed)
Patient verbalizes understanding of discharge instructions. Opportunity for questioning and answers were provided. Armband removed by staff, pt discharged from ED home via [facility] with family.

## 2017-12-27 NOTE — ED Triage Notes (Signed)
Pt reports concern that her BP is high. States earlier she felt dizzy but resolved at this time

## 2017-12-27 NOTE — Discharge Instructions (Addendum)
We signed the ER for the elevated blood pressure. Please start taking medications prescribed.  Please keep a log of the blood pressures as we discussed.

## 2017-12-27 NOTE — ED Notes (Signed)
Pt reports htn and has concerns for same.

## 2017-12-28 NOTE — ED Provider Notes (Signed)
[facility] EMERGENCY DEPARTMENT Provider Note   CSN: [id number] Arrival date & time: 12/27/17  2233     History   Chief Complaint Chief Complaint  Patient presents with  . Hypertension    HPI Jade Kennedy is a 47 y.o. female.  HPI  47 year old female comes in with chief complaint of elevated blood pressure.  Patient has history of anemia.  She reports that earlier today she had an episode of tingling in her head.  That prompted her to check her blood pressure, which came back at 086 systolic.  As the day progressed patient has continued to check her blood pressure and it has steadily risen to systolic of 578 which prompted her to come to the ER.  Patient does not have any chest pain, shortness of breath, headache, dizziness, vision change.  She denies any change in her diet besides eating a fish which was salty over the Thanksgiving weekend.  Patient drinks 2 to 3 cups of coffee regularly and does not use any energy drinks or stimulants.  Past Medical History:  Diagnosis Date  . Microcytic anemia   . Migraine   . Venous insufficiency     Patient Active Problem List   Diagnosis Date Noted  . Microcytic anemia 05/29/2015  . Visit for preventive health examination 05/24/2015    Past Surgical History:  Procedure Laterality Date  . CESAREAN SECTION  2010/2011     OB History   None      Home Medications    Prior to Admission medications   Medication Sig Start Date End Date Taking? Authorizing Provider  Multiple Vitamins-Iron (MULTIVITAMIN/IRON PO) Take by mouth.   Yes [provider]  Ascorbic Acid (VITAMIN C) 100 MG tablet Take 100 mg by mouth daily as needed (Migraines).     [provider]  ferrous sulfate 325 (65 FE) MG tablet Take 1 tablet (325 mg total) by mouth 2 (two) times daily with a meal. Patient not taking: Reported on 04/11/2016 05/25/15   Brunetta Jeans, PA-C  hydrochlorothiazide (HYDRODIURIL) 25 MG tablet Take 1 tablet  (25 mg total) by mouth daily. 12/27/17   Varney Biles, MD  SUMAtriptan (IMITREX) 100 MG tablet Take 100 mg by mouth every 2 (two) hours as needed for migraine. May repeat in 2 hours if headache persists or recurs.    [provider]  tranexamic acid (LYSTEDA) 650 MG TABS tablet Take 1,300 mg by mouth 3 (three) times daily. 2 tablets 3 times a day    [provider]    Family History Family History  Problem Relation Age of Onset  . Heart disease Mother   . Hypertension Sister   . Hypertension Brother   . Healthy Child        x 3 -- 20, 45 and 37 years old    Social History Social History   Tobacco Use  . Smoking status: Never Smoker  . Smokeless tobacco: Never Used  Substance Use Topics  . Alcohol use: Not Currently    Alcohol/week: 0.0 standard drinks    Comment: Ocass-Wine   . Drug use: No     Allergies   Patient has no known allergies.   Review of Systems Review of Systems  Constitutional: Negative for activity change.  Respiratory: Negative for shortness of breath.   Cardiovascular: Negative for chest pain.  Neurological: Negative for dizziness, syncope, light-headedness and headaches.     Physical Exam Updated Vital Signs BP (!) 178/100 (BP Location: Right  Arm)   Pulse 62   Temp 98.2 F (36.8 C) (Oral)   Resp 16   Ht 4' 11"  (1.499 m)   Wt 53.1 kg   LMP 12/24/2017   SpO2 100%   BMI 23.63 kg/m   Physical Exam  Constitutional: She is oriented to person, place, and time. She appears well-developed.  HENT:  Head: Normocephalic and atraumatic.  Eyes: EOM are normal.  Neck: Normal range of motion. Neck supple.  Cardiovascular: Normal rate.  Pulmonary/Chest: Effort normal. She has no rales.  Abdominal: Bowel sounds are normal.  Neurological: She is alert and oriented to person, place, and time. No cranial nerve deficit. Coordination normal.  Skin: Skin is warm and dry.  Nursing note and vitals reviewed.    ED Treatments / Results    Labs (all labs ordered are listed, but only abnormal results are displayed) Labs Reviewed - No data to display  EKG None  Radiology No results found.  Procedures Procedures (including critical care time)  Medications Ordered in ED Medications  hydrochlorothiazide (HYDRODIURIL) tablet 25 mg (25 mg Oral Given 12/27/17 2355)     Initial Impression / Assessment and Plan / ED Course  I have reviewed the triage vital signs and the nursing notes.  Pertinent labs & imaging results that were available during my care of the patient were reviewed by me and considered in my medical decision making (see chart for details).     47 year old female comes in with chief complaint of elevated blood pressure.  Patient's blood pressure is noted to be in the systolic 768-115 range in the ER. She does not have any symptoms of endorgan damage. Given that her blood pressure is significantly high and we do not have any reason for her to have reversible cause for  secondary hypertension, we will start her on hydrochlorothiazide.  They will call her family doctor on Monday for a prompt follow-up. We have advised him to take blood pressure readings and logging them regularly. Lab work-up from 6 months ago reviewed.  Patient would want her PCP to check the blood work if needed which is not a problem for [place].  Final Clinical Impressions(s) / ED Diagnoses   Final diagnoses:  Hypertension, unspecified type    ED Discharge Orders         Ordered    hydrochlorothiazide (HYDRODIURIL) 25 MG tablet  Daily     12/27/17 2340           Varney Biles, MD 12/28/17 [phone]

## 2017-12-29 ENCOUNTER — Telehealth: Payer: Self-pay | Admitting: Family Medicine

## 2017-12-29 NOTE — Telephone Encounter (Signed)
Called her back -LMOM that we are looking forward to seeing her on Wednesday and will look at her BP then

## 2017-12-29 NOTE — Telephone Encounter (Signed)
She called back and we spoke,I answered her questions

## 2017-12-29 NOTE — Telephone Encounter (Signed)
Copied from [place] [id number]. Topic: General - Other >> Dec 29, 2017  8:39 AM Jade Kennedy R wrote: Patient is requesting a call from Dr Lorelei Pont to discuss her ER visit. Futre Appt scheduled 01/01/2018

## 2017-12-30 NOTE — Progress Notes (Addendum)
[facility] [address], [place], [place] [address] [phone]  Date:  01/01/2018   Name:  Jade Kennedy   DOB:  [birth year]   MRN:  [id number]  PCP:  Darreld Mclean, MD    Chief Complaint: No chief complaint on file.   History of Present Illness:  Jade Kennedy is a 47 y.o. very pleasant female patient who presents with the following:  Following up today on her BP I saw her last in June Seen in the ER on 11/30 as follows: 47 year old female comes in with chief complaint of elevated blood pressure.  Patient has history of anemia.  She reports that earlier today she had an episode of tingling in her head.  That prompted her to check her blood pressure, which came back at 295 systolic.  As the day progressed patient has continued to check her blood pressure and it has steadily risen to systolic of 188 which prompted her to come to the ER. Patient does not have any chest pain, shortness of breath, headache, dizziness, vision change.  She denies any change in her diet besides eating a fish which was salty over the Thanksgiving weekend.  Patient drinks 2 to 3 cups of coffee regularly and does not use any energy drinks or stimulants./////////// 47 year old female comes in with chief complaint of elevated blood pressure.  Patient's blood pressure is noted to be in the systolic 416-606 range in the ER. She does not have any symptoms of endorgan damage. Given that her blood pressure is significantly high and we do not have any reason for her to have reversible cause for  secondary hypertension, we will start her on hydrochlorothiazide.  She checked her BP this am and it was on the low side so he did not take HCTZ today Her BP has always been ok in the past She thinks that perhaps her BP was up due to excessive salt intake and anxiety There is a family history of HTN however   BP Readings from Last 3 Encounters:  01/01/18  115/80  12/27/17 (!) 178/100  07/02/17 110/80  needs an EKG and labs today   She does admit to some SOB at times which she has blamed on anemia She does not get a lot of exercise; once or twice a week she does some abs   No chest tightness or pain However she does note that she might feel SOB if she is rushing around the house   Her mom does have CAD and had CABG in her 27s No other family history except one brother and one sister have HTN  She is not a smoker  Pt also notes that she will get migraine HA at times She has had migraine since she was in college Often occur before her menstrual period  She will notice light and sound sensitivity and nausea  Sometimes vomiting She does not get aura She will take imitrex and it helps as long as she gets it in her right away Migraine may occur intermittently- she was ok for several months but had 4 last month On average she may get 2 in a month  She wonders if ok to use ibuprofen with imitrex and I have reassured her that this is ok  However do not use both imitrex and tranexamic acid   Patient Active Problem List   Diagnosis Date Noted  . Microcytic anemia 05/29/2015  . Visit for preventive  health examination 05/24/2015    Past Medical History:  Diagnosis Date  . Microcytic anemia   . Migraine   . Venous insufficiency     Past Surgical History:  Procedure Laterality Date  . CESAREAN SECTION  2010/2011    Social History   Tobacco Use  . Smoking status: Never Smoker  . Smokeless tobacco: Never Used  Substance Use Topics  . Alcohol use: Not Currently    Alcohol/week: 0.0 standard drinks    Comment: Ocass-Wine   . Drug use: No    Family History  Problem Relation Age of Onset  . Heart disease Mother   . Hypertension Sister   . Hypertension Brother   . Healthy Child        x 3 -- 21, 91 and 58 years old    No Known Allergies  Medication list has been reviewed and updated.  Current Outpatient Medications on  File Prior to Visit  Medication Sig Dispense Refill  . Ascorbic Acid (VITAMIN C) 100 MG tablet Take 100 mg by mouth daily as needed (Migraines).     . ferrous sulfate 325 (65 FE) MG tablet Take 1 tablet (325 mg total) by mouth 2 (two) times daily with a meal. 60 tablet 0  . hydrochlorothiazide (HYDRODIURIL) 25 MG tablet Take 1 tablet (25 mg total) by mouth daily. 10 tablet 0  . ibuprofen (ADVIL,MOTRIN) 800 MG tablet Take 1 tab po q6-8 hrs prn cramping or headache    . Multiple Vitamins-Iron (MULTIVITAMIN/IRON PO) Take by mouth.    . SUMAtriptan (IMITREX) 100 MG tablet Take 1/2 tab po x1 at onset of migraine. If still have headache, may repeat in 2 hrs. Do not exceed 233m in 24 hours    . tranexamic acid (LYSTEDA) 650 MG TABS tablet Take 1,300 mg by mouth 3 (three) times daily. 2 tablets 3 times a day     No current facility-administered medications on file prior to visit.     Review of Systems:  As per HPI- otherwise negative. No fever or chills No chest discomfort or SOB now   Physical Examination: Vitals:   01/01/18 1107  BP: 115/80  Pulse: 88  Resp: 16  Temp: 98.1 F (36.7 C)  SpO2: 99%   Vitals:   01/01/18 1107  Weight: 113 lb (51.3 kg)  Height: 4' 10"  (1.473 m)   Body mass index is 23.62 kg/m. Ideal Body Weight: Weight in (lb) to have BMI = 25: 119.4  GEN: WDWN, NAD, Non-toxic, A & O x 3, looks well, normal weight HEENT: Atraumatic, Normocephalic. Neck supple. No masses, No LAD.  Bilateral TM wnl, oropharynx normal.  PEERL,EOMI.   Ears and Nose: No external deformity. CV: RRR, No M/G/R. No JVD. No thrill. No extra heart sounds. PULM: CTA B, no wheezes, crackles, rhonchi. No retractions. No resp. distress. No accessory muscle use. ABD: S, NT, ND, +BS. No rebound. No HSM. EXTR: No c/c/e NEURO Normal gait.  PSYCH: Normally interactive. Conversant. Not depressed or anxious appearing.  Calm demeanor.   EKG:  Short PR and non- specific T wave abnl. No old tracing for  comparison  Assessment and Plan: Benign essential HTN - Plan: CBC, Comprehensive metabolic panel, TSH, EKG 163-OVFI SOB (shortness of breath) - Plan: Troponin I -  Migraine without aura and without status migrainosus, not intractable  Seen in the ER recently with elevated blood pressure.  She had never had hypertension in the past. She was given hydrochlorothiazide by the ER and  took it for several days, however she did not take it so far today.  Her blood pressure is normal today.  She wonders if she can stop taking her blood pressure medication. We will have her check blood pressure at home for the next several days.  As long as she is running less than 140/90 she does not need to restart her blood pressure medication. She does admit to shortness of breath with physical activity today though no chest pain. Her EKG is also slightly abnl.  Will refer her to cardiology out of an abundance of caution.   She will let me know if any change or worsening of her symptoms in the meantime. She describes migraine headache typically twice a month.  He is able to treat this with Imitrex.  At this time we will continue to monitor, if she continues to have more frequent headache as she did in the last month we may need to start a preventive medication such as Topamax. Follow-up with patient pending labs today.  Signed Lamar Blinks, MD  Received her troponin- negative  Results for orders placed or performed in visit on 01/01/18  Troponin I -  Result Value Ref Range   TNIDX 0.00 0.00 - 0.06 ug/l   Await her other labs   Results for orders placed or performed in visit on 01/01/18  CBC  Result Value Ref Range   WBC 5.2 4.0 - 10.5 K/uL   RBC 4.82 3.87 - 5.11 Mil/uL   Platelets 303.0 150.0 - 400.0 K/uL   Hemoglobin 13.8 12.0 - 15.0 g/dL   HCT 42.3 36.0 - 46.0 %   MCV 87.9 78.0 - 100.0 fl   MCHC 32.5 30.0 - 36.0 g/dL   RDW 13.4 11.5 - 15.5 %  Comprehensive metabolic panel  Result Value Ref  Range   Sodium 138 135 - 145 mEq/L   Potassium 3.9 3.5 - 5.1 mEq/L   Chloride 99 96 - 112 mEq/L   CO2 32 19 - 32 mEq/L   Glucose, Bld 101 (H) 70 - 99 mg/dL   BUN 14 6 - 23 mg/dL   Creatinine, Ser 0.73 0.40 - 1.20 mg/dL   Total Bilirubin 0.5 0.2 - 1.2 mg/dL   Alkaline Phosphatase 60 39 - 117 U/L   AST 18 0 - 37 U/L   ALT 18 0 - 35 U/L   Total Protein 7.9 6.0 - 8.3 g/dL   Albumin 4.3 3.5 - 5.2 g/dL   Calcium 10.0 8.4 - 10.5 mg/dL   GFR 90.80 >60.00 mL/min  TSH  Result Value Ref Range   TSH 1.26 0.35 - 4.50 uIU/mL  Troponin I -  Result Value Ref Range   TNIDX 0.00 0.00 - 0.06 ug/l  letter to pt

## 2018-01-01 ENCOUNTER — Ambulatory Visit (INDEPENDENT_AMBULATORY_CARE_PROVIDER_SITE_OTHER): Payer: BLUE CROSS/BLUE SHIELD | Admitting: Family Medicine

## 2018-01-01 ENCOUNTER — Encounter: Payer: Self-pay | Admitting: Family Medicine

## 2018-01-01 VITALS — BP 115/80 | HR 88 | Temp 98.1°F | Resp 16 | Ht <= 58 in | Wt 113.0 lb

## 2018-01-01 DIAGNOSIS — I10 Essential (primary) hypertension: Secondary | ICD-10-CM | POA: Diagnosis not present

## 2018-01-01 DIAGNOSIS — R0602 Shortness of breath: Secondary | ICD-10-CM | POA: Diagnosis not present

## 2018-01-01 DIAGNOSIS — G43009 Migraine without aura, not intractable, without status migrainosus: Secondary | ICD-10-CM

## 2018-01-01 LAB — COMPREHENSIVE METABOLIC PANEL
ALBUMIN: 4.3 g/dL (ref 3.5–5.2)
ALK PHOS: 60 U/L (ref 39–117)
ALT: 18 U/L (ref 0–35)
AST: 18 U/L (ref 0–37)
BILIRUBIN TOTAL: 0.5 mg/dL (ref 0.2–1.2)
BUN: 14 mg/dL (ref 6–23)
CALCIUM: 10 mg/dL (ref 8.4–10.5)
CO2: 32 meq/L (ref 19–32)
Chloride: 99 mEq/L (ref 96–112)
Creatinine, Ser: 0.73 mg/dL (ref 0.40–1.20)
GFR: 90.8 mL/min (ref >60.00)
Glucose, Bld: 101 mg/dL — ABNORMAL HIGH (ref 70–99)
Potassium: 3.9 mEq/L (ref 3.5–5.1)
Sodium: 138 mEq/L (ref 135–145)
TOTAL PROTEIN: 7.9 g/dL (ref 6.0–8.3)

## 2018-01-01 LAB — CBC
HCT: 42.3 % (ref 36.0–46.0)
HEMOGLOBIN: 13.8 g/dL (ref 12.0–15.0)
MCHC: 32.5 g/dL (ref 30.0–36.0)
MCV: 87.9 fl (ref 78.0–100.0)
PLATELETS: 303 10*3/uL (ref 150.0–400.0)
RBC: 4.82 Mil/uL (ref 3.87–5.11)
RDW: 13.4 % (ref 11.5–15.5)
WBC: 5.2 10*3/uL (ref 4.0–10.5)

## 2018-01-01 LAB — TROPONIN I: TNIDX: 0 ug/L (ref 0.00–0.06)

## 2018-01-01 LAB — TSH: TSH: 1.26 u[IU]/mL (ref 0.35–4.50)

## 2018-01-01 NOTE — Patient Instructions (Signed)
It was good to see you today Your BP looks fine  Continue to not take your blood pressure medication for the next few days. Measure your blood pressure at home; if it starts to go over 140/90, then restart your blood pressure medication (HCTZ) We are going to refer you to cardiology for an evaluation. Let me know if any changes or concerns in the meantime. I will be in touch with your labs. Please also alert me if you continue to have more frequent headaches, in that case we may start on another medication to help prevent headaches.  Otherwise it is certainly okay to continue Imitrex as needed.  Take care!

## 2018-08-15 NOTE — Progress Notes (Addendum)
[place] at [facility] [address] [place], [place] [address] [phone]  Date:  08/17/2018   Name:  Jade Kennedy   DOB:  31-Jan-1970   MRN:  [id number]  PCP:  Darreld Mclean, MD    Chief Complaint: Annual Exam (denies pap today)   History of Present Illness:  Jade Kennedy is a 48 y.o. very pleasant female patient who presents with the following:  Here today for complete physical History of microcytic anemia Last seen by myself in December-at that point she had recently been to the ER with concern of elevated blood pressure.  She had been given a low-dose of HCTZ, but was not taking at our last visit.  Her blood pressure was normal  She has 3 children- 16, 10 and 44 yo GYN is Dr. Jacqualyn Posey at [facility] Pap: per her GYN- however she would like to change to doing this here, declines today but I am glad to do a Pap at her convenience Mammogram: 1 year ago, order update today Due for complete labs; she is fasting for the last 6 hours   She does check her BP at home on occasion -however she is not sure of her exact numbers  Patient Active Problem List   Diagnosis Date Noted  . Microcytic anemia 05/29/2015  . Visit for preventive health examination 05/24/2015    Past Medical History:  Diagnosis Date  . Microcytic anemia   . Migraine   . Venous insufficiency     Past Surgical History:  Procedure Laterality Date  . CESAREAN SECTION  2010/2011    Social History   Tobacco Use  . Smoking status: Never Smoker  . Smokeless tobacco: Never Used  Substance Use Topics  . Alcohol use: Not Currently    Alcohol/week: 0.0 standard drinks    Comment: Ocass-Wine   . Drug use: No    Family History  Problem Relation Age of Onset  . Heart disease Mother   . Hypertension Sister   . Hypertension Brother   . Healthy Child        x 3 -- 40, 1 and 48 years old    No Known Allergies  Medication list has been reviewed and  updated.  Current Outpatient Medications on File Prior to Visit  Medication Sig Dispense Refill  . Ascorbic Acid (VITAMIN C) 100 MG tablet Take 100 mg by mouth daily as needed (Migraines).     . ferrous sulfate 325 (65 FE) MG tablet Take 1 tablet (325 mg total) by mouth 2 (two) times daily with a meal. 60 tablet 0  . Multiple Vitamins-Iron (MULTIVITAMIN/IRON PO) Take by mouth.    [employer] Specialty Vitamins Products (ECHINACEA C COMPLETE PO) Take by mouth.    . SUMAtriptan (IMITREX) 100 MG tablet Take 1/2 tab po x1 at onset of migraine. If still have headache, may repeat in 2 hrs. Do not exceed 223m in 24 hours    . tranexamic acid (LYSTEDA) 650 MG TABS tablet Take 1,300 mg by mouth 3 (three) times daily. 2 tablets 3 times a day    . hydrochlorothiazide (HYDRODIURIL) 25 MG tablet Take 1 tablet (25 mg total) by mouth daily. (Patient not taking: Reported on 08/17/2018) 10 tablet 0   No current facility-administered medications on file prior to visit.     Review of Systems:  As per HPI- otherwise negative. She notes occasional migraine HA-   Physical Examination: Vitals:   08/17/18  1319  BP: 134/90  Pulse: 82  Resp: 16  Temp: 98.1 F (36.7 C)  SpO2: 98%   Vitals:   08/17/18 1319  Weight: 115 lb (52.2 kg)  Height: 4' 10"  (1.473 m)   Body mass index is 24.04 kg/m. Ideal Body Weight: Weight in (lb) to have BMI = 25: 119.4  GEN: WDWN, NAD, Non-toxic, A & O x 3, normal weight, looks well HEENT: Atraumatic, Normocephalic. Neck supple. No masses, No LAD. TM wnl, PEERL Ears and Nose: No external deformity. CV: RRR, No M/G/R. No JVD. No thrill. No extra heart sounds. PULM: CTA B, no wheezes, crackles, rhonchi. No retractions. No resp. distress. No accessory muscle use. ABD: S, NT, ND, +BS. No rebound. No HSM.  Benign abdomen EXTR: No c/c/e NEURO Normal gait.  PSYCH: Normally interactive. Conversant. Not depressed or anxious appearing.  Calm demeanor.   Waist 28 in Assessment and  Plan:   ICD-10-CM   1. Physical exam  Z00.00   2. History of hypochromic microcytic anemia  Z86.2 CBC    Ferritin  3. Screening for diabetes mellitus  Z13.1 Comprehensive metabolic panel    Hemoglobin A1c  4. Screening for breast cancer  Z12.39 MM 3D SCREEN BREAST BILATERAL  5. Screening for hyperlipidemia  Z13.220 Lipid panel  6. Chronic migraine without aura without status migrainosus, not intractable  G43.709 SUMAtriptan (IMITREX) 100 MG tablet   Here today for complete physical Labs pending as above-remind patient about Tdap with lab results Order mammogram Refilled her headache medication, she has rare migraines Blood pressure borderline today.  She will monitor this at home, will update me in 1 to 2 weeks Follow-up: No follow-ups on file.  Meds ordered this encounter  Medications  . SUMAtriptan (IMITREX) 100 MG tablet    Sig: Take 0.5 tablets (50 mg total) by mouth once for 1 dose. May repeat in 2 hours if headache persists or recurs.    Dispense:  10 tablet    Refill:  2   Orders Placed This Encounter  Procedures  . MM 3D SCREEN BREAST BILATERAL  . CBC  . Comprehensive metabolic panel  . Hemoglobin A1c  . Lipid panel  . Ferritin    @SIGN @   She has a form that needs to be faxed in to her insurance  Signed Lamar Blinks, MD  Received her labs 7/21- completed form for her insurance and will fax Message to pt  Results for orders placed or performed in visit on 08/17/18  CBC  Result Value Ref Range   WBC 5.7 4.0 - 10.5 K/uL   RBC 4.21 3.87 - 5.11 Mil/uL   Platelets 234.0 150.0 - 400.0 K/uL   Hemoglobin 11.8 (L) 12.0 - 15.0 g/dL   HCT 36.4 36.0 - 46.0 %   MCV 86.4 78.0 - 100.0 fl   MCHC 32.4 30.0 - 36.0 g/dL   RDW 14.3 11.5 - 15.5 %  Comprehensive metabolic panel  Result Value Ref Range   Sodium 140 135 - 145 mEq/L   Potassium 3.5 3.5 - 5.1 mEq/L   Chloride 103 96 - 112 mEq/L   CO2 28 19 - 32 mEq/L   Glucose, Bld 81 70 - 99 mg/dL   BUN 9 6 - 23 mg/dL    Creatinine, Ser 0.61 0.40 - 1.20 mg/dL   Total Bilirubin 0.5 0.2 - 1.2 mg/dL   Alkaline Phosphatase 65 39 - 117 U/L   AST 23 0 - 37 U/L   ALT 18 0 -  35 U/L   Total Protein 6.9 6.0 - 8.3 g/dL   Albumin 4.3 3.5 - 5.2 g/dL   Calcium 8.9 8.4 - 10.5 mg/dL   GFR 104.82 >60.00 mL/min  Hemoglobin A1c  Result Value Ref Range   Hgb A1c MFr Bld 5.7 4.6 - 6.5 %  Lipid panel  Result Value Ref Range   Cholesterol 152 0 - 200 mg/dL   Triglycerides 48.0 0.0 - 149.0 mg/dL   HDL 69.90 >39.00 mg/dL   VLDL 9.6 0.0 - 40.0 mg/dL   LDL Cholesterol 72 0 - 99 mg/dL   Total CHOL/HDL Ratio 2    NonHDL 81.82   Ferritin  Result Value Ref Range   Ferritin 6.8 (L) 10.0 - 291.0 ng/mL

## 2018-08-17 ENCOUNTER — Other Ambulatory Visit: Payer: Self-pay

## 2018-08-17 ENCOUNTER — Encounter: Payer: Self-pay | Admitting: Family Medicine

## 2018-08-17 ENCOUNTER — Ambulatory Visit (INDEPENDENT_AMBULATORY_CARE_PROVIDER_SITE_OTHER): Payer: BC Managed Care – PPO | Admitting: Family Medicine

## 2018-08-17 VITALS — BP 134/90 | HR 82 | Temp 98.1°F | Resp 16 | Ht <= 58 in | Wt 115.0 lb

## 2018-08-17 DIAGNOSIS — Z1239 Encounter for other screening for malignant neoplasm of breast: Secondary | ICD-10-CM | POA: Diagnosis not present

## 2018-08-17 DIAGNOSIS — G43709 Chronic migraine without aura, not intractable, without status migrainosus: Secondary | ICD-10-CM

## 2018-08-17 DIAGNOSIS — Z13220 Encounter for screening for lipoid disorders: Secondary | ICD-10-CM | POA: Diagnosis not present

## 2018-08-17 DIAGNOSIS — Z0000 Encounter for general adult medical examination without abnormal findings: Secondary | ICD-10-CM

## 2018-08-17 DIAGNOSIS — Z862 Personal history of diseases of the blood and blood-forming organs and certain disorders involving the immune mechanism: Secondary | ICD-10-CM | POA: Diagnosis not present

## 2018-08-17 DIAGNOSIS — Z131 Encounter for screening for diabetes mellitus: Secondary | ICD-10-CM

## 2018-08-17 LAB — CBC
HCT: 36.4 % (ref 36.0–46.0)
Hemoglobin: 11.8 g/dL — ABNORMAL LOW (ref 12.0–15.0)
MCHC: 32.4 g/dL (ref 30.0–36.0)
MCV: 86.4 fl (ref 78.0–100.0)
Platelets: 234 10*3/uL (ref 150.0–400.0)
RBC: 4.21 Mil/uL (ref 3.87–5.11)
RDW: 14.3 % (ref 11.5–15.5)
WBC: 5.7 10*3/uL (ref 4.0–10.5)

## 2018-08-17 LAB — COMPREHENSIVE METABOLIC PANEL
ALT: 18 U/L (ref 0–35)
AST: 23 U/L (ref 0–37)
Albumin: 4.3 g/dL (ref 3.5–5.2)
Alkaline Phosphatase: 65 U/L (ref 39–117)
BUN: 9 mg/dL (ref 6–23)
CO2: 28 mEq/L (ref 19–32)
Calcium: 8.9 mg/dL (ref 8.4–10.5)
Chloride: 103 mEq/L (ref 96–112)
Creatinine, Ser: 0.61 mg/dL (ref 0.40–1.20)
GFR: 104.82 mL/min (ref >60.00)
Glucose, Bld: 81 mg/dL (ref 70–99)
Potassium: 3.5 mEq/L (ref 3.5–5.1)
Sodium: 140 mEq/L (ref 135–145)
Total Bilirubin: 0.5 mg/dL (ref 0.2–1.2)
Total Protein: 6.9 g/dL (ref 6.0–8.3)

## 2018-08-17 LAB — LIPID PANEL
Cholesterol: 152 mg/dL (ref 0–200)
HDL: 69.9 mg/dL (ref >39.00)
LDL Cholesterol: 72 mg/dL (ref 0–99)
NonHDL: 81.82
Total CHOL/HDL Ratio: 2
Triglycerides: 48 mg/dL (ref 0.0–149.0)
VLDL: 9.6 mg/dL (ref 0.0–40.0)

## 2018-08-17 LAB — HEMOGLOBIN A1C: Hgb A1c MFr Bld: 5.7 % (ref 4.6–6.5)

## 2018-08-17 LAB — FERRITIN: Ferritin: 6.8 ng/mL — ABNORMAL LOW (ref 10.0–291.0)

## 2018-08-17 MED ORDER — SUMATRIPTAN SUCCINATE 100 MG PO TABS: 50.0000 mg | ORAL_TABLET | Freq: Once | ORAL | 2 refills | Status: DC

## 2018-08-17 NOTE — Patient Instructions (Signed)
It was great to see you today- take care and I will be in touch with your labs asap Please check your BP several times over the next couple of weeks and let me know how it look at home If continuing to run high we may need to put you back on some BP medication  Please schedule your mammogram ASAP  I will fax in your insurance form once your labs come in   Take care!    Health Maintenance, Female Adopting a healthy lifestyle and getting preventive care are important in promoting health and wellness. Ask your health care provider about:  The right schedule for you to have regular tests and exams.  Things you can do on your own to prevent diseases and keep yourself healthy. What should I know about diet, weight, and exercise? Eat a healthy diet   Eat a diet that includes plenty of vegetables, fruits, low-fat dairy products, and lean protein.  Do not eat a lot of foods that are high in solid fats, added sugars, or sodium. Maintain a healthy weight Body mass index (BMI) is used to identify weight problems. It estimates body fat based on height and weight. Your health care provider can help determine your BMI and help you achieve or maintain a healthy weight. Get regular exercise Get regular exercise. This is one of the most important things you can do for your health. Most adults should:  Exercise for at least 150 minutes each week. The exercise should increase your heart rate and make you sweat (moderate-intensity exercise).  Do strengthening exercises at least twice a week. This is in addition to the moderate-intensity exercise.  Spend less time sitting. Even light physical activity can be beneficial. Watch cholesterol and blood lipids Have your blood tested for lipids and cholesterol at 48 years of age, then have this test every 5 years. Have your cholesterol levels checked more often if:  Your lipid or cholesterol levels are high.  You are older than 48 years of age.  You are  at high risk for heart disease. What should I know about cancer screening? Depending on your health history and family history, you may need to have cancer screening at various ages. This may include screening for:  Breast cancer.  Cervical cancer.  Colorectal cancer.  Skin cancer.  Lung cancer. What should I know about heart disease, diabetes, and high blood pressure? Blood pressure and heart disease  High blood pressure causes heart disease and increases the risk of stroke. This is more likely to develop in people who have high blood pressure readings, are of African descent, or are overweight.  Have your blood pressure checked: ? Every 3-5 years if you are 66-59 years of age. ? Every year if you are 18 years old or older. Diabetes Have regular diabetes screenings. This checks your fasting blood sugar level. Have the screening done:  Once every three years after age 12 if you are at a normal weight and have a low risk for diabetes.  More often and at a younger age if you are overweight or have a high risk for diabetes. What should I know about preventing infection? Hepatitis B If you have a higher risk for hepatitis B, you should be screened for this virus. Talk with your health care provider to find out if you are at risk for hepatitis B infection. Hepatitis C Testing is recommended for:  Everyone born from [birth year] through 1965.  Anyone with known risk factors for  hepatitis C. Sexually transmitted infections (STIs)  Get screened for STIs, including gonorrhea and chlamydia, if: ? You are sexually active and are younger than 48 years of age. ? You are older than 48 years of age and your health care provider tells you that you are at risk for this type of infection. ? Your sexual activity has changed since you were last screened, and you are at increased risk for chlamydia or gonorrhea. Ask your health care provider if you are at risk.  Ask your health care provider about  whether you are at high risk for HIV. Your health care provider may recommend a prescription medicine to help prevent HIV infection. If you choose to take medicine to prevent HIV, you should first get tested for HIV. You should then be tested every 3 months for as long as you are taking the medicine. Pregnancy  If you are about to stop having your period (premenopausal) and you may become pregnant, seek counseling before you get pregnant.  Take 400 to 800 micrograms (mcg) of folic acid every day if you become pregnant.  Ask for birth control (contraception) if you want to prevent pregnancy. Osteoporosis and menopause Osteoporosis is a disease in which the bones lose minerals and strength with aging. This can result in bone fractures. If you are 37 years old or older, or if you are at risk for osteoporosis and fractures, ask your health care provider if you should:  Be screened for bone loss.  Take a calcium or vitamin D supplement to lower your risk of fractures.  Be given hormone replacement therapy (HRT) to treat symptoms of menopause. Follow these instructions at home: Lifestyle  Do not use any products that contain nicotine or tobacco, such as cigarettes, e-cigarettes, and chewing tobacco. If you need help quitting, ask your health care provider.  Do not use street drugs.  Do not share needles.  Ask your health care provider for help if you need support or information about quitting drugs. Alcohol use  Do not drink alcohol if: ? Your health care provider tells you not to drink. ? You are pregnant, may be pregnant, or are planning to become pregnant.  If you drink alcohol: ? Limit how much you use to 0-1 drink a day. ? Limit intake if you are breastfeeding.  Be aware of how much alcohol is in your drink. In the [place] one drink equals one 12 oz bottle of beer (355 mL), one 5 oz glass of wine (148 mL), or one 1 oz glass of hard liquor (44 mL). General instructions  Schedule  regular health, dental, and eye exams.  Stay current with your vaccines.  Tell your health care provider if: ? You often feel depressed. ? You have ever been abused or do not feel safe at home. Summary  Adopting a healthy lifestyle and getting preventive care are important in promoting health and wellness.  Follow your health care provider's instructions about healthy diet, exercising, and getting tested or screened for diseases.  Follow your health care provider's instructions on monitoring your cholesterol and blood pressure. This information is not intended to replace advice given to you by your health care provider. Make sure you discuss any questions you have with your health care provider. Document Released: 07/30/2010 Document Revised: 01/07/2018 Document Reviewed: 01/07/2018 Elsevier Patient Education  2020 [employer].

## 2018-08-18 ENCOUNTER — Encounter: Payer: Self-pay | Admitting: Family Medicine

## 2018-08-18 NOTE — Addendum Note (Signed)
Addended by: Lamar Blinks C on: 08/18/2018 05:20 PM   Modules accepted: Orders

## 2018-08-21 ENCOUNTER — Encounter: Payer: Self-pay | Admitting: Family Medicine

## 2018-08-21 ENCOUNTER — Telehealth: Payer: Self-pay

## 2018-08-21 MED ORDER — HYDROCHLOROTHIAZIDE 25 MG PO TABS: 12.5000 mg | ORAL_TABLET | Freq: Every day | ORAL | 3 refills | Status: DC

## 2018-08-21 NOTE — Telephone Encounter (Signed)
Copied from [place] [id number]. Topic: General - Other >> Aug 21, 2018  3:23 PM Leward Quan A wrote: Reason for CRM: Patient called to speak to Dr Lorelei Pont asking for a call back to discuss her BP readings states that she would really like a call back. She did not give the readings say that she prefer to talk to the doctor. Please advise,  Ph# [phone]

## 2018-08-23 NOTE — Telephone Encounter (Signed)
Taken care of

## 2019-06-25 ENCOUNTER — Other Ambulatory Visit: Payer: Self-pay | Admitting: Family Medicine

## 2019-09-22 ENCOUNTER — Other Ambulatory Visit: Payer: Self-pay | Admitting: Family Medicine

## 2019-10-12 ENCOUNTER — Telehealth: Payer: Self-pay

## 2019-10-12 DIAGNOSIS — I10 Essential (primary) hypertension: Secondary | ICD-10-CM

## 2019-10-12 DIAGNOSIS — Z1329 Encounter for screening for other suspected endocrine disorder: Secondary | ICD-10-CM

## 2019-10-12 DIAGNOSIS — Z13220 Encounter for screening for lipoid disorders: Secondary | ICD-10-CM

## 2019-10-12 DIAGNOSIS — Z862 Personal history of diseases of the blood and blood-forming organs and certain disorders involving the immune mechanism: Secondary | ICD-10-CM

## 2019-10-12 DIAGNOSIS — Z131 Encounter for screening for diabetes mellitus: Secondary | ICD-10-CM

## 2019-10-12 NOTE — Addendum Note (Signed)
Addended by: Lamar Blinks C on: 10/12/2019 02:01 PM   Modules accepted: Orders

## 2019-10-12 NOTE — Telephone Encounter (Signed)
Patient called needing to be scheduled for CPE. She requested labs prior to visit to discuss at appointment.  I have scheduled both CPE and lab appointment that Monday before. Could you please place labs?

## 2019-10-18 ENCOUNTER — Other Ambulatory Visit: Payer: Self-pay

## 2019-10-18 ENCOUNTER — Other Ambulatory Visit (INDEPENDENT_AMBULATORY_CARE_PROVIDER_SITE_OTHER): Payer: BC Managed Care – PPO

## 2019-10-18 DIAGNOSIS — Z862 Personal history of diseases of the blood and blood-forming organs and certain disorders involving the immune mechanism: Secondary | ICD-10-CM

## 2019-10-18 DIAGNOSIS — I10 Essential (primary) hypertension: Secondary | ICD-10-CM | POA: Diagnosis not present

## 2019-10-18 DIAGNOSIS — Z13220 Encounter for screening for lipoid disorders: Secondary | ICD-10-CM | POA: Diagnosis not present

## 2019-10-18 DIAGNOSIS — Z131 Encounter for screening for diabetes mellitus: Secondary | ICD-10-CM

## 2019-10-18 DIAGNOSIS — Z1329 Encounter for screening for other suspected endocrine disorder: Secondary | ICD-10-CM

## 2019-10-19 LAB — LIPID PANEL
Cholesterol: 163 mg/dL (ref <200)
HDL: 63 mg/dL (ref >=50)
LDL Cholesterol (Calc): 79 mg/dL (calc)
Non-HDL Cholesterol (Calc): 100 mg/dL (calc) (ref <130)
Total CHOL/HDL Ratio: 2.6 (calc) (ref <5.0)
Triglycerides: 113 mg/dL (ref <150)

## 2019-10-19 LAB — CBC
HCT: 40 % (ref 35.0–45.0)
Hemoglobin: 13 g/dL (ref 11.7–15.5)
MCH: 29.1 pg (ref 27.0–33.0)
MCHC: 32.5 g/dL (ref 32.0–36.0)
MCV: 89.7 fL (ref 80.0–100.0)
MPV: 10 fL (ref 7.5–12.5)
Platelets: 280 10*3/uL (ref 140–400)
RBC: 4.46 10*6/uL (ref 3.80–5.10)
RDW: 11.7 % (ref 11.0–15.0)
WBC: 5.6 10*3/uL (ref 3.8–10.8)

## 2019-10-19 LAB — COMPREHENSIVE METABOLIC PANEL
AG Ratio: 1.5 (calc) (ref 1.0–2.5)
ALT: 21 U/L (ref 6–29)
AST: 25 U/L (ref 10–35)
Albumin: 4.3 g/dL (ref 3.6–5.1)
Alkaline phosphatase (APISO): 67 U/L (ref 31–125)
BUN: 13 mg/dL (ref 7–25)
CO2: 30 mmol/L (ref 20–32)
Calcium: 9.5 mg/dL (ref 8.6–10.2)
Chloride: 100 mmol/L (ref 98–110)
Creat: 0.72 mg/dL (ref 0.50–1.10)
Globulin: 2.8 g/dL (calc) (ref 1.9–3.7)
Glucose, Bld: 95 mg/dL (ref 65–99)
Potassium: 3.5 mmol/L (ref 3.5–5.3)
Sodium: 139 mmol/L (ref 135–146)
Total Bilirubin: 0.4 mg/dL (ref 0.2–1.2)
Total Protein: 7.1 g/dL (ref 6.1–8.1)

## 2019-10-19 LAB — FERRITIN: Ferritin: 39 ng/mL (ref 16–232)

## 2019-10-19 LAB — HEMOGLOBIN A1C
Hgb A1c MFr Bld: 5.6 % of total Hgb (ref <5.7)
Mean Plasma Glucose: 114 (calc)
eAG (mmol/L): 6.3 (calc)

## 2019-10-19 LAB — TSH: TSH: 1.74 mIU/L

## 2019-10-19 NOTE — Progress Notes (Deleted)
[place] at [facility] [address] [place], [place] [address] [phone]  Date:  10/21/2019   Name:  Jade Kennedy   DOB:  10/07/1970   MRN:  [id number]  PCP:  Darreld Mclean, MD    Chief Complaint: No chief complaint on file.   History of Present Illness:  Jade Kennedy is a 49 y.o. very pleasant female patient who presents with the following:  Generally healthy young woman here today for a CPE She came in for labs already as below- all looked great Last seen by myself about one year ago  Married with 3 children ages 17,11,9 Her GYN is with [facility]   Results for orders placed or performed in visit on 10/18/19  Ferritin  Result Value Ref Range   Ferritin 39 16 - 232 ng/mL  TSH  Result Value Ref Range   TSH 1.74 mIU/L  Lipid panel  Result Value Ref Range   Cholesterol 163 <200 mg/dL   HDL 63 > OR = 50 mg/dL   Triglycerides 113 <150 mg/dL   LDL Cholesterol (Calc) 79 mg/dL (calc)   Total CHOL/HDL Ratio 2.6 <5.0 (calc)   Non-HDL Cholesterol (Calc) 100 <130 mg/dL (calc)  Hemoglobin A1c  Result Value Ref Range   Hgb A1c MFr Bld 5.6 <5.7 % of total Hgb   Mean Plasma Glucose 114 (calc)   eAG (mmol/L) 6.3 (calc)  Comprehensive metabolic panel  Result Value Ref Range   Glucose, Bld 95 65 - 99 mg/dL   BUN 13 7 - 25 mg/dL   Creat 0.72 0.50 - 1.10 mg/dL   BUN/Creatinine Ratio NOT APPLICABLE 6 - 22 (calc)   Sodium 139 135 - 146 mmol/L   Potassium 3.5 3.5 - 5.3 mmol/L   Chloride 100 98 - 110 mmol/L   CO2 30 20 - 32 mmol/L   Calcium 9.5 8.6 - 10.2 mg/dL   Total Protein 7.1 6.1 - 8.1 g/dL   Albumin 4.3 3.6 - 5.1 g/dL   Globulin 2.8 1.9 - 3.7 g/dL (calc)   AG Ratio 1.5 1.0 - 2.5 (calc)   Total Bilirubin 0.4 0.2 - 1.2 mg/dL   Alkaline phosphatase (APISO) 67 31 - 125 U/L   AST 25 10 - 35 U/L   ALT 21 6 - 29 U/L  CBC  Result Value Ref Range   WBC 5.6 3.8 - 10.8 Thousand/uL   RBC 4.46 3.80 - 5.10 Million/uL    Hemoglobin 13.0 11.7 - 15.5 g/dL   HCT 40.0 35 - 45 %   MCV 89.7 80.0 - 100.0 fL   MCH 29.1 27.0 - 33.0 pg   MCHC 32.5 32.0 - 36.0 g/dL   RDW 11.7 11.0 - 15.0 %   Platelets 280 140 - 400 Thousand/uL   MPV 10.0 7.5 - 12.5 fL   Pap Tetanus Flu shot covid series mammo Colon   Patient Active Problem List   Diagnosis Date Noted  . Microcytic anemia 05/29/2015  . Visit for preventive health examination 05/24/2015    Past Medical History:  Diagnosis Date  . Microcytic anemia   . Migraine   . Venous insufficiency     Past Surgical History:  Procedure Laterality Date  . CESAREAN SECTION  2010/2011    Social History   Tobacco Use  . Smoking status: Never Smoker  . Smokeless tobacco: Never Used  Vaping Use  . Vaping Use: Never used  Substance Use Topics  . Alcohol  use: Not Currently    Alcohol/week: 0.0 standard drinks    Comment: Ocass-Wine   . Drug use: No    Family History  Problem Relation Age of Onset  . Heart disease Mother   . Hypertension Sister   . Hypertension Brother   . Healthy Child        x 3 -- 36, 18 and 37 years old    No Known Allergies  Medication list has been reviewed and updated.  Current Outpatient Medications on File Prior to Visit  Medication Sig Dispense Refill  . Ascorbic Acid (VITAMIN C) 100 MG tablet Take 100 mg by mouth daily as needed (Migraines).     . ferrous sulfate 325 (65 FE) MG tablet Take 1 tablet (325 mg total) by mouth 2 (two) times daily with a meal. 60 tablet 0  . hydrochlorothiazide (HYDRODIURIL) 25 MG tablet TAKE 1/2 (ONE-HALF) TABLET BY MOUTH ONCE DAILY ,  MAY  INCREASE  TO  1  TABLET  IF  DIRECTED  BY  DOCTOR 30 tablet 0  . Multiple Vitamins-Iron (MULTIVITAMIN/IRON PO) Take by mouth.    Marland Kitchen Specialty Vitamins Products (ECHINACEA C COMPLETE PO) Take by mouth.    . SUMAtriptan (IMITREX) 100 MG tablet Take 0.5 tablets (50 mg total) by mouth once for 1 dose. May repeat in 2 hours if headache persists or recurs. 10 tablet 2   . tranexamic acid (LYSTEDA) 650 MG TABS tablet Take 1,300 mg by mouth 3 (three) times daily. 2 tablets 3 times a day     No current facility-administered medications on file prior to visit.    Review of Systems:  As per HPI- otherwise negative.   Physical Examination: There were no vitals filed for this visit. There were no vitals filed for this visit. There is no height or weight on file to calculate BMI. Ideal Body Weight:    GEN: no acute distress. HEENT: Atraumatic, Normocephalic.  Ears and Nose: No external deformity. CV: RRR, No M/G/R. No JVD. No thrill. No extra heart sounds. PULM: CTA B, no wheezes, crackles, rhonchi. No retractions. No resp. distress. No accessory muscle use. ABD: S, NT, ND, +BS. No rebound. No HSM. EXTR: No c/c/e PSYCH: Normally interactive. Conversant.    Assessment and Plan: *** This visit occurred during the SARS-CoV-2 public health emergency.  Safety protocols were in place, including screening questions prior to the visit, additional usage of staff PPE, and extensive cleaning of exam room while observing appropriate contact time as indicated for disinfecting solutions.    Signed Lamar Blinks, MD

## 2019-10-19 NOTE — Patient Instructions (Addendum)
Great to see you again today- I will be in touch with your labs You got your tetanus shot today Pap is pending Ordered mammogram for you - please schedule at your convenience Support/ compression socks may help your legs while you are at work We will get you a cologaurd kit to sceen for colon cancer    Health Maintenance, Female Adopting a healthy lifestyle and getting preventive care are important in promoting health and wellness. Ask your health care provider about:  The right schedule for you to have regular tests and exams.  Things you can do on your own to prevent diseases and keep yourself healthy. What should I know about diet, weight, and exercise? Eat a healthy diet   Eat a diet that includes plenty of vegetables, fruits, low-fat dairy products, and lean protein.  Do not eat a lot of foods that are high in solid fats, added sugars, or sodium. Maintain a healthy weight Body mass index (BMI) is used to identify weight problems. It estimates body fat based on height and weight. Your health care provider can help determine your BMI and help you achieve or maintain a healthy weight. Get regular exercise Get regular exercise. This is one of the most important things you can do for your health. Most adults should:  Exercise for at least 150 minutes each week. The exercise should increase your heart rate and make you sweat (moderate-intensity exercise).  Do strengthening exercises at least twice a week. This is in addition to the moderate-intensity exercise.  Spend less time sitting. Even light physical activity can be beneficial. Watch cholesterol and blood lipids Have your blood tested for lipids and cholesterol at 50 years of age, then have this test every 5 years. Have your cholesterol levels checked more often if:  Your lipid or cholesterol levels are high.  You are older than 49 years of age.  You are at high risk for heart disease. What should I know about cancer  screening? Depending on your health history and family history, you may need to have cancer screening at various ages. This may include screening for:  Breast cancer.  Cervical cancer.  Colorectal cancer.  Skin cancer.  Lung cancer. What should I know about heart disease, diabetes, and high blood pressure? Blood pressure and heart disease  High blood pressure causes heart disease and increases the risk of stroke. This is more likely to develop in people who have high blood pressure readings, are of African descent, or are overweight.  Have your blood pressure checked: ? Every 3-5 years if you are 22-34 years of age. ? Every year if you are 60 years old or older. Diabetes Have regular diabetes screenings. This checks your fasting blood sugar level. Have the screening done:  Once every three years after age 66 if you are at a normal weight and have a low risk for diabetes.  More often and at a younger age if you are overweight or have a high risk for diabetes. What should I know about preventing infection? Hepatitis B If you have a higher risk for hepatitis B, you should be screened for this virus. Talk with your health care provider to find out if you are at risk for hepatitis B infection. Hepatitis C Testing is recommended for:  Everyone born from [birth year] through 1965.  Anyone with known risk factors for hepatitis C. Sexually transmitted infections (STIs)  Get screened for STIs, including gonorrhea and chlamydia, if: ? You are sexually active and  are younger than 49 years of age. ? You are older than 49 years of age and your health care provider tells you that you are at risk for this type of infection. ? Your sexual activity has changed since you were last screened, and you are at increased risk for chlamydia or gonorrhea. Ask your health care provider if you are at risk.  Ask your health care provider about whether you are at high risk for HIV. Your health care provider may  recommend a prescription medicine to help prevent HIV infection. If you choose to take medicine to prevent HIV, you should first get tested for HIV. You should then be tested every 3 months for as long as you are taking the medicine. Pregnancy  If you are about to stop having your period (premenopausal) and you may become pregnant, seek counseling before you get pregnant.  Take 400 to 800 micrograms (mcg) of folic acid every day if you become pregnant.  Ask for birth control (contraception) if you want to prevent pregnancy. Osteoporosis and menopause Osteoporosis is a disease in which the bones lose minerals and strength with aging. This can result in bone fractures. If you are 61 years old or older, or if you are at risk for osteoporosis and fractures, ask your health care provider if you should:  Be screened for bone loss.  Take a calcium or vitamin D supplement to lower your risk of fractures.  Be given hormone replacement therapy (HRT) to treat symptoms of menopause. Follow these instructions at home: Lifestyle  Do not use any products that contain nicotine or tobacco, such as cigarettes, e-cigarettes, and chewing tobacco. If you need help quitting, ask your health care provider.  Do not use street drugs.  Do not share needles.  Ask your health care provider for help if you need support or information about quitting drugs. Alcohol use  Do not drink alcohol if: ? Your health care provider tells you not to drink. ? You are pregnant, may be pregnant, or are planning to become pregnant.  If you drink alcohol: ? Limit how much you use to 0-1 drink a day. ? Limit intake if you are breastfeeding.  Be aware of how much alcohol is in your drink. In the [place] one drink equals one 12 oz bottle of beer (355 mL), one 5 oz glass of wine (148 mL), or one 1 oz glass of hard liquor (44 mL). General instructions  Schedule regular health, dental, and eye exams.  Stay current with your  vaccines.  Tell your health care provider if: ? You often feel depressed. ? You have ever been abused or do not feel safe at home. Summary  Adopting a healthy lifestyle and getting preventive care are important in promoting health and wellness.  Follow your health care provider's instructions about healthy diet, exercising, and getting tested or screened for diseases.  Follow your health care provider's instructions on monitoring your cholesterol and blood pressure. This information is not intended to replace advice given to you by your health care provider. Make sure you discuss any questions you have with your health care provider. Document Revised: 01/07/2018 Document Reviewed: 01/07/2018 Elsevier Patient Education  2020 [employer].

## 2019-10-20 NOTE — Progress Notes (Deleted)
[place] at [facility] [address], [place], [place] [address] [phone]  Date:  10/21/2019   Name:  Jade Kennedy   DOB:  27-Oct-1970   MRN:  [id number]  PCP:  Darreld Mclean, MD    Chief Complaint: No chief complaint on file.   History of Present Illness:  Jade Kennedy is a 48 y.o. very pleasant female patient who presents with the following:  Pt here today for a CPE  Patient came in for lab work already  Results for orders placed or performed in visit on 10/18/19  Ferritin  Result Value Ref Range   Ferritin 39 16 - 232 ng/mL  TSH  Result Value Ref Range   TSH 1.74 mIU/L  Lipid panel  Result Value Ref Range   Cholesterol 163 <200 mg/dL   HDL 63 > OR = 50 mg/dL   Triglycerides 113 <150 mg/dL   LDL Cholesterol (Calc) 79 mg/dL (calc)   Total CHOL/HDL Ratio 2.6 <5.0 (calc)   Non-HDL Cholesterol (Calc) 100 <130 mg/dL (calc)  Hemoglobin A1c  Result Value Ref Range   Hgb A1c MFr Bld 5.6 <5.7 % of total Hgb   Mean Plasma Glucose 114 (calc)   eAG (mmol/L) 6.3 (calc)  Comprehensive metabolic panel  Result Value Ref Range   Glucose, Bld 95 65 - 99 mg/dL   BUN 13 7 - 25 mg/dL   Creat 0.72 0.50 - 1.10 mg/dL   BUN/Creatinine Ratio NOT APPLICABLE 6 - 22 (calc)   Sodium 139 135 - 146 mmol/L   Potassium 3.5 3.5 - 5.3 mmol/L   Chloride 100 98 - 110 mmol/L   CO2 30 20 - 32 mmol/L   Calcium 9.5 8.6 - 10.2 mg/dL   Total Protein 7.1 6.1 - 8.1 g/dL   Albumin 4.3 3.6 - 5.1 g/dL   Globulin 2.8 1.9 - 3.7 g/dL (calc)   AG Ratio 1.5 1.0 - 2.5 (calc)   Total Bilirubin 0.4 0.2 - 1.2 mg/dL   Alkaline phosphatase (APISO) 67 31 - 125 U/L   AST 25 10 - 35 U/L   ALT 21 6 - 29 U/L  CBC  Result Value Ref Range   WBC 5.6 3.8 - 10.8 Thousand/uL   RBC 4.46 3.80 - 5.10 Million/uL   Hemoglobin 13.0 11.7 - 15.5 g/dL   HCT 40.0 35 - 45 %   MCV 89.7 80.0 - 100.0 fL   MCH 29.1 27.0 - 33.0 pg   MCHC 32.5 32.0 - 36.0 g/dL   RDW 11.7  11.0 - 15.0 %   Platelets 280 140 - 400 Thousand/uL   MPV 10.0 7.5 - 12.5 fL      Patient Active Problem List   Diagnosis Date Noted  . Microcytic anemia 05/29/2015  . Visit for preventive health examination 05/24/2015    Past Medical History:  Diagnosis Date  . Microcytic anemia   . Migraine   . Venous insufficiency     Past Surgical History:  Procedure Laterality Date  . CESAREAN SECTION  2010/2011    Social History   Tobacco Use  . Smoking status: Never Smoker  . Smokeless tobacco: Never Used  Vaping Use  . Vaping Use: Never used  Substance Use Topics  . Alcohol use: Not Currently    Alcohol/week: 0.0 standard drinks    Comment: Ocass-Wine   . Drug use: No    Family History  Problem Relation Age of Onset  .  Heart disease Mother   . Hypertension Sister   . Hypertension Brother   . Healthy Child        x 3 -- 73, 63 and 82 years old    No Known Allergies  Medication list has been reviewed and updated.  Current Outpatient Medications on File Prior to Visit  Medication Sig Dispense Refill  . Ascorbic Acid (VITAMIN C) 100 MG tablet Take 100 mg by mouth daily as needed (Migraines).     . ferrous sulfate 325 (65 FE) MG tablet Take 1 tablet (325 mg total) by mouth 2 (two) times daily with a meal. 60 tablet 0  . hydrochlorothiazide (HYDRODIURIL) 25 MG tablet TAKE 1/2 (ONE-HALF) TABLET BY MOUTH ONCE DAILY ,  MAY  INCREASE  TO  1  TABLET  IF  DIRECTED  BY  DOCTOR 30 tablet 0  . Multiple Vitamins-Iron (MULTIVITAMIN/IRON PO) Take by mouth.    Marland Kitchen Specialty Vitamins Products (ECHINACEA C COMPLETE PO) Take by mouth.    . SUMAtriptan (IMITREX) 100 MG tablet Take 0.5 tablets (50 mg total) by mouth once for 1 dose. May repeat in 2 hours if headache persists or recurs. 10 tablet 2  . tranexamic acid (LYSTEDA) 650 MG TABS tablet Take 1,300 mg by mouth 3 (three) times daily. 2 tablets 3 times a day     No current facility-administered medications on file prior to visit.     Review of Systems:  As per HPI- otherwise negative.   Physical Examination: There were no vitals filed for this visit. There were no vitals filed for this visit. There is no height or weight on file to calculate BMI. Ideal Body Weight:    GEN: no acute distress. HEENT: Atraumatic, Normocephalic.  Ears and Nose: No external deformity. CV: RRR, No M/G/R. No JVD. No thrill. No extra heart sounds. PULM: CTA B, no wheezes, crackles, rhonchi. No retractions. No resp. distress. No accessory muscle use. ABD: S, NT, ND, +BS. No rebound. No HSM. EXTR: No c/c/e PSYCH: Normally interactive. Conversant.    Assessment and Plan: *** This visit occurred during the SARS-CoV-2 public health emergency.  Safety protocols were in place, including screening questions prior to the visit, additional usage of staff PPE, and extensive cleaning of exam room while observing appropriate contact time as indicated for disinfecting solutions.    Signed Lamar Blinks, MD

## 2019-10-21 ENCOUNTER — Other Ambulatory Visit: Payer: Self-pay

## 2019-10-21 ENCOUNTER — Encounter: Payer: Self-pay | Admitting: Family Medicine

## 2019-10-21 ENCOUNTER — Other Ambulatory Visit (HOSPITAL_COMMUNITY)
Admission: RE | Admit: 2019-10-21 | Discharge: 2019-10-21 | Disposition: A | Discharge status: Home / Self Care | Payer: BC Managed Care – PPO | Source: Ambulatory Visit | Attending: Family Medicine | Admitting: Family Medicine

## 2019-10-21 ENCOUNTER — Ambulatory Visit (INDEPENDENT_AMBULATORY_CARE_PROVIDER_SITE_OTHER): Payer: BC Managed Care – PPO | Admitting: Family Medicine

## 2019-10-21 ENCOUNTER — Encounter: Payer: BC Managed Care – PPO | Admitting: Family Medicine

## 2019-10-21 VITALS — BP 110/72 | HR 76 | Resp 16 | Ht <= 58 in | Wt 114.0 lb

## 2019-10-21 DIAGNOSIS — Z23 Encounter for immunization: Secondary | ICD-10-CM

## 2019-10-21 DIAGNOSIS — Z1231 Encounter for screening mammogram for malignant neoplasm of breast: Secondary | ICD-10-CM | POA: Diagnosis not present

## 2019-10-21 DIAGNOSIS — Z1211 Encounter for screening for malignant neoplasm of colon: Secondary | ICD-10-CM | POA: Diagnosis not present

## 2019-10-21 DIAGNOSIS — Z0000 Encounter for general adult medical examination without abnormal findings: Secondary | ICD-10-CM

## 2019-10-21 DIAGNOSIS — Z124 Encounter for screening for malignant neoplasm of cervix: Secondary | ICD-10-CM | POA: Insufficient documentation

## 2019-10-21 NOTE — Progress Notes (Signed)
[facility] [place], [place], [place] [address] [phone]  Date:  10/21/2019   Name:  Jade Kennedy   DOB:  Feb 01, 1970   MRN:  [id number]  PCP:  Darreld Mclean, MD    Chief Complaint: Annual Exam   History of Present Illness:  Jade Kennedy is a 49 y.o. very pleasant female patient who presents with the following:  Pt here today for a CPE  Patient came in for lab work already- went over her labs as below, all ok  Married with 3 children ages 17, 20,9 Would like to do pap today- she is unsure of date of last pap, no abnormal pap in the past  Flu shot will be done at her job covid is complete- she had both doses  Mammogram is due- ordered for her today Menses are regular but not too heavy, occasional hot flash recently Colon cancer screening: no family history, would like to do colguard   She is very active at her job and does a lot of walking She works at [employer] She is not sure of date of last tetanus vaccine- will update today  Results for orders placed or performed in visit on 10/18/19  Ferritin  Result Value Ref Range   Ferritin 39 16 - 232 ng/mL  TSH  Result Value Ref Range   TSH 1.74 mIU/L  Lipid panel  Result Value Ref Range   Cholesterol 163 <200 mg/dL   HDL 63 > OR = 50 mg/dL   Triglycerides 113 <150 mg/dL   LDL Cholesterol (Calc) 79 mg/dL (calc)   Total CHOL/HDL Ratio 2.6 <5.0 (calc)   Non-HDL Cholesterol (Calc) 100 <130 mg/dL (calc)  Hemoglobin A1c  Result Value Ref Range   Hgb A1c MFr Bld 5.6 <5.7 % of total Hgb   Mean Plasma Glucose 114 (calc)   eAG (mmol/L) 6.3 (calc)  Comprehensive metabolic panel  Result Value Ref Range   Glucose, Bld 95 65 - 99 mg/dL   BUN 13 7 - 25 mg/dL   Creat 0.72 0.50 - 1.10 mg/dL   BUN/Creatinine Ratio NOT APPLICABLE 6 - 22 (calc)   Sodium 139 135 - 146 mmol/L   Potassium 3.5 3.5 - 5.3 mmol/L   Chloride 100 98 - 110 mmol/L   CO2 30  20 - 32 mmol/L   Calcium 9.5 8.6 - 10.2 mg/dL   Total Protein 7.1 6.1 - 8.1 g/dL   Albumin 4.3 3.6 - 5.1 g/dL   Globulin 2.8 1.9 - 3.7 g/dL (calc)   AG Ratio 1.5 1.0 - 2.5 (calc)   Total Bilirubin 0.4 0.2 - 1.2 mg/dL   Alkaline phosphatase (APISO) 67 31 - 125 U/L   AST 25 10 - 35 U/L   ALT 21 6 - 29 U/L  CBC  Result Value Ref Range   WBC 5.6 3.8 - 10.8 Thousand/uL   RBC 4.46 3.80 - 5.10 Million/uL   Hemoglobin 13.0 11.7 - 15.5 g/dL   HCT 40.0 35 - 45 %   MCV 89.7 80.0 - 100.0 fL   MCH 29.1 27.0 - 33.0 pg   MCHC 32.5 32.0 - 36.0 g/dL   RDW 11.7 11.0 - 15.0 %   Platelets 280 140 - 400 Thousand/uL   MPV 10.0 7.5 - 12.5 fL      Patient Active Problem List   Diagnosis Date Noted  . Microcytic anemia 05/29/2015  . Visit for preventive  health examination 05/24/2015    Past Medical History:  Diagnosis Date  . Microcytic anemia   . Migraine   . Venous insufficiency     Past Surgical History:  Procedure Laterality Date  . CESAREAN SECTION  2010/2011    Social History   Tobacco Use  . Smoking status: Never Smoker  . Smokeless tobacco: Never Used  Vaping Use  . Vaping Use: Never used  Substance Use Topics  . Alcohol use: Not Currently    Alcohol/week: 0.0 standard drinks    Comment: Ocass-Wine   . Drug use: No    Family History  Problem Relation Age of Onset  . Heart disease Mother   . Hypertension Sister   . Hypertension Brother   . Healthy Child        x 3 -- 76, 52 and 57 years old    No Known Allergies  Medication list has been reviewed and updated.  Current Outpatient Medications on File Prior to Visit  Medication Sig Dispense Refill  . Ascorbic Acid (VITAMIN C) 100 MG tablet Take 100 mg by mouth daily as needed (Migraines).     . ferrous sulfate 325 (65 FE) MG tablet Take 1 tablet (325 mg total) by mouth 2 (two) times daily with a meal. 60 tablet 0  . hydrochlorothiazide (HYDRODIURIL) 25 MG tablet TAKE 1/2 (ONE-HALF) TABLET BY MOUTH ONCE DAILY ,  MAY   INCREASE  TO  1  TABLET  IF  DIRECTED  BY  DOCTOR 30 tablet 0  . Multiple Vitamins-Iron (MULTIVITAMIN/IRON PO) Take by mouth.    Mar[place] Kitchen Specialty Vitamins Products (ECHINACEA C COMPLETE PO) Take by mouth.    . tranexamic acid (LYSTEDA) 650 MG TABS tablet Take 1,300 mg by mouth 3 (three) times daily. 2 tablets 3 times a day    . SUMAtriptan (IMITREX) 100 MG tablet Take 0.5 tablets (50 mg total) by mouth once for 1 dose. May repeat in 2 hours if headache persists or recurs. 10 tablet 2   No current facility-administered medications on file prior to visit.    Review of Systems:  As per HPI- otherwise negative.   Physical Examination: Vitals:   10/21/19 1537  BP: 110/72  Pulse: 76  Resp: 16  SpO2: 98%   Vitals:   10/21/19 1537  Weight: 114 lb (51.7 kg)  Height: 4' 10"  (1.473 m)   Body mass index is 23.83 kg/m. Ideal Body Weight: Weight in (lb) to have BMI = 25: 119.4  GEN: no acute distress.  Petite build, looks well HEENT: Atraumatic, Normocephalic.   Bilateral TM wnl, oropharynx normal.  PEERL,EOMI.   Ears and Nose: No external deformity. CV: RRR, No M/G/R. No JVD. No thrill. No extra heart sounds. PULM: CTA B, no wheezes, crackles, rhonchi. No retractions. No resp. distress. No accessory muscle use. ABD: S, NT, ND, +BS. No rebound. No HSM. EXTR: No c/c/e PSYCH: Normally interactive. Conversant.  Collected pap- normal vulva, vagina, cervix   Assessment and Plan: Physical exam  Screening for cervical cancer - Plan: Cytology - PAP  Encounter for screening mammogram for malignant neoplasm of breast - Plan: MM 3D SCREEN BREAST BILATERAL  Immunization due - Plan: Tdap vaccine greater than or equal to 7yo IM   Pt here today for a CPE Pap pending Ordered mammo Gave tdap Labs already done Order cologuard  This visit occurred during the SARS-CoV-2 public health emergency.  Safety protocols were in place, including screening questions prior to the visit, additional usage  of  staff PPE, and extensive cleaning of exam room while observing appropriate contact time as indicated for disinfecting solutions.    Signed Lamar Blinks, MD

## 2019-10-21 NOTE — Progress Notes (Signed)
Cologuard ordered for patient.

## 2019-10-25 LAB — CYTOLOGY - PAP
Comment: NEGATIVE
Diagnosis: NEGATIVE
High risk HPV: NEGATIVE

## 2019-10-26 ENCOUNTER — Encounter: Payer: Self-pay | Admitting: Family Medicine

## 2019-11-24 DIAGNOSIS — Z1211 Encounter for screening for malignant neoplasm of colon: Secondary | ICD-10-CM | POA: Diagnosis not present

## 2019-11-24 LAB — COLOGUARD: Cologuard: NEGATIVE

## 2019-11-30 ENCOUNTER — Telehealth: Payer: Self-pay | Admitting: Family Medicine

## 2019-11-30 ENCOUNTER — Other Ambulatory Visit: Payer: Self-pay | Admitting: Family Medicine

## 2019-11-30 NOTE — Telephone Encounter (Signed)
Pt is out of medicine  Medication:  hydrochlorothiazide (HYDRODIURIL) 25 MG tablet [[id number]]      Has the patient contacted their pharmacy?  (If no, request that the patient contact the pharmacy for the refill.) (If yes, when and what did the pharmacy advise?)     Preferred Pharmacy (with phone number or street name): [facility] [address] [place], [place] - [address]  [address] [place] [address]  Phone:  [phone] Fax:  [phone]      Agent: Please be advised that RX refills may take up to 3 business days. We ask that you follow-up with your pharmacy.

## 2019-12-01 NOTE — Telephone Encounter (Signed)
Patient's medication sent in yesterday.

## 2019-12-04 LAB — COLOGUARD: COLOGUARD: NEGATIVE

## 2019-12-04 LAB — EXTERNAL GENERIC LAB PROCEDURE: COLOGUARD: NEGATIVE

## 2019-12-06 ENCOUNTER — Encounter: Payer: Self-pay | Admitting: Family Medicine

## 2020-05-04 ENCOUNTER — Other Ambulatory Visit: Payer: Self-pay

## 2020-05-04 MED ORDER — HYDROCHLOROTHIAZIDE 25 MG PO TABS: 12.5000 mg | ORAL_TABLET | Freq: Every day | ORAL | 3 refills | Status: DC

## 2020-05-04 NOTE — Telephone Encounter (Signed)
Received fax from pharmacy requesting refill on Hydrochlorothiazide 5 mg.   Refill sent to pharmacy. Patient due for follow up in September.

## 2020-06-27 ENCOUNTER — Telehealth: Payer: Self-pay

## 2020-06-27 MED ORDER — HYDROCHLOROTHIAZIDE 25 MG PO TABS: 12.5000 mg | ORAL_TABLET | Freq: Every day | ORAL | 5 refills | Status: DC

## 2020-06-27 MED ORDER — HYDROCHLOROTHIAZIDE 25 MG PO TABS: 12.5000 mg | ORAL_TABLET | Freq: Every day | ORAL | 30 refills | Status: DC

## 2020-06-27 NOTE — Telephone Encounter (Signed)
Pt called stating she need a refill on hydrochlorothoazide.  She needs a one month refill sent to [facility] and then all remaining refills are to go to [facility].  She wants all other scripts to be fill through [facility] from here on out as well.

## 2020-06-27 NOTE — Addendum Note (Signed)
Addended by: Wynonia Musty A on: 06/27/2020 04:00 PM   Modules accepted: Orders

## 2020-06-27 NOTE — Telephone Encounter (Signed)
Meds refilled to appropriate pharmacy.

## 2020-10-16 DIAGNOSIS — L811 Chloasma: Secondary | ICD-10-CM | POA: Diagnosis not present

## 2020-10-19 ENCOUNTER — Telehealth: Payer: Self-pay | Admitting: *Deleted

## 2020-10-19 DIAGNOSIS — I10 Essential (primary) hypertension: Secondary | ICD-10-CM

## 2020-10-19 DIAGNOSIS — R5383 Other fatigue: Secondary | ICD-10-CM

## 2020-10-19 DIAGNOSIS — Z862 Personal history of diseases of the blood and blood-forming organs and certain disorders involving the immune mechanism: Secondary | ICD-10-CM

## 2020-10-19 DIAGNOSIS — Z1329 Encounter for screening for other suspected endocrine disorder: Secondary | ICD-10-CM

## 2020-10-19 DIAGNOSIS — Z131 Encounter for screening for diabetes mellitus: Secondary | ICD-10-CM

## 2020-10-19 DIAGNOSIS — Z13220 Encounter for screening for lipoid disorders: Secondary | ICD-10-CM

## 2020-10-19 NOTE — Telephone Encounter (Signed)
Pt has lab appointment tomorrow for CPE labs. I do not see any future orders in Epic.  Please place future orders.

## 2020-10-20 ENCOUNTER — Other Ambulatory Visit (INDEPENDENT_AMBULATORY_CARE_PROVIDER_SITE_OTHER): Payer: BC Managed Care – PPO

## 2020-10-20 ENCOUNTER — Other Ambulatory Visit: Payer: Self-pay

## 2020-10-20 ENCOUNTER — Encounter: Payer: Self-pay | Admitting: Family Medicine

## 2020-10-20 DIAGNOSIS — Z13220 Encounter for screening for lipoid disorders: Secondary | ICD-10-CM

## 2020-10-20 DIAGNOSIS — Z1329 Encounter for screening for other suspected endocrine disorder: Secondary | ICD-10-CM | POA: Diagnosis not present

## 2020-10-20 DIAGNOSIS — R5383 Other fatigue: Secondary | ICD-10-CM

## 2020-10-20 DIAGNOSIS — Z862 Personal history of diseases of the blood and blood-forming organs and certain disorders involving the immune mechanism: Secondary | ICD-10-CM

## 2020-10-20 DIAGNOSIS — I10 Essential (primary) hypertension: Secondary | ICD-10-CM | POA: Diagnosis not present

## 2020-10-20 DIAGNOSIS — Z131 Encounter for screening for diabetes mellitus: Secondary | ICD-10-CM

## 2020-10-20 LAB — CBC
HCT: 37.9 % (ref 36.0–46.0)
Hemoglobin: 12.3 g/dL (ref 12.0–15.0)
MCHC: 32.3 g/dL (ref 30.0–36.0)
MCV: 88.8 fl (ref 78.0–100.0)
Platelets: 239 10*3/uL (ref 150.0–400.0)
RBC: 4.27 Mil/uL (ref 3.87–5.11)
RDW: 12.5 % (ref 11.5–15.5)
WBC: 4 10*3/uL (ref 4.0–10.5)

## 2020-10-20 LAB — COMPREHENSIVE METABOLIC PANEL
ALT: 19 U/L (ref 0–35)
AST: 20 U/L (ref 0–37)
Albumin: 4.2 g/dL (ref 3.5–5.2)
Alkaline Phosphatase: 62 U/L (ref 39–117)
BUN: 15 mg/dL (ref 6–23)
CO2: 30 mEq/L (ref 19–32)
Calcium: 9.1 mg/dL (ref 8.4–10.5)
Chloride: 103 mEq/L (ref 96–112)
Creatinine, Ser: 0.62 mg/dL (ref 0.40–1.20)
GFR: 104.24 mL/min (ref >60.00)
Glucose, Bld: 88 mg/dL (ref 70–99)
Potassium: 3.5 mEq/L (ref 3.5–5.1)
Sodium: 141 mEq/L (ref 135–145)
Total Bilirubin: 0.4 mg/dL (ref 0.2–1.2)
Total Protein: 7.1 g/dL (ref 6.0–8.3)

## 2020-10-20 LAB — LIPID PANEL
Cholesterol: 149 mg/dL (ref 0–200)
HDL: 60.6 mg/dL (ref >39.00)
LDL Cholesterol: 76 mg/dL (ref 0–99)
NonHDL: 88.47
Total CHOL/HDL Ratio: 2
Triglycerides: 61 mg/dL (ref 0.0–149.0)
VLDL: 12.2 mg/dL (ref 0.0–40.0)

## 2020-10-20 LAB — TSH: TSH: 1.41 u[IU]/mL (ref 0.35–5.50)

## 2020-10-20 LAB — VITAMIN D 25 HYDROXY (VIT D DEFICIENCY, FRACTURES): VITD: 14.34 ng/mL — ABNORMAL LOW (ref 30.00–100.00)

## 2020-10-20 LAB — HEMOGLOBIN A1C: Hgb A1c MFr Bld: 6.2 % (ref 4.6–6.5)

## 2020-10-20 NOTE — Patient Instructions (Addendum)
Good to see you again today!  Please take the high dose vitamin D WEEKLY for 12 weeks- then change over to a daily OTC supplement with 2000 iu vitamin D daily  The lab is adding on an iron level-I will be in touch with this asap  Please schedule a mammogram asap- you can stop by the imaging dept on the ground floor here today   Please get your flu shot and new covid booster this fall

## 2020-10-20 NOTE — Progress Notes (Signed)
[facility] at [facility] [place], [place], [place] [address] [phone]  Date:  10/23/2020   Name:  Jade Kennedy   DOB:  25-Jan-1971   MRN:  [id number]  PCP:  Jade Mclean, MD    Chief Complaint: Annual Exam (Concerns/ questions: left arm/ wrist pain, hot flashes/Flu shot today: yes/Hep C screen due/Covid booster: will consider /Gyn- not anymore)   History of Present Illness:  Jade Kennedy is a 50 y.o. very pleasant female patient who presents with the following:  Patient seen today for physical exam.  She is generally in good health  Last visit with myself about 1 year ago She works at [employer] in [place], she is quite active at her job.  There is a lot of walking in her day-to-day routine  Married, 3 children ages 40, 13, 30- her oldest is at [employer], studying engineering  Pap done last year-normal with negative HPV  She had labs drawn last week as below-need to replete vitamin D Colon cancer screening-patient had negative Cologuard 10/21 COVID booster- she plans to do at pharmacy Flu vaccine-she plans to do at pharmacy Mammogram 2019- ordered for her   She is taking iron daily still -we will add a ferritin onto her recent blood work Discussed vitamin D replacement She has noted pain in the left wrist, dorsal ulnar aspect.  She is not aware of any particular injury.  Is been present for about a month, not getting better  I saw her husband today as well.  He mentions that Jade Kennedy seems to spend excessive amounts of time cleaning, he is worried that she is having anxiety or possibly OCD symptoms especially since COVID began.  I mentioned this to her today, she feels that she is spending an appropriate amount of time cleaning, she notes no distress or concern about her cleaning habits. Results for orders placed or performed in visit on 10/20/20  VITAMIN D 25 Hydroxy (Vit-D Deficiency, Fractures)  Result  Value Ref Range   VITD 14.34 (L) 30.00 - 100.00 ng/mL  TSH  Result Value Ref Range   TSH 1.41 0.35 - 5.50 uIU/mL  Lipid panel  Result Value Ref Range   Cholesterol 149 0 - 200 mg/dL   Triglycerides 61.0 0.0 - 149.0 mg/dL   HDL 60.60 >39.00 mg/dL   VLDL 12.2 0.0 - 40.0 mg/dL   LDL Cholesterol 76 0 - 99 mg/dL   Total CHOL/HDL Ratio 2    NonHDL 88.47   Hemoglobin A1c  Result Value Ref Range   Hgb A1c MFr Bld 6.2 4.6 - 6.5 %  Comprehensive metabolic panel  Result Value Ref Range   Sodium 141 135 - 145 mEq/L   Potassium 3.5 3.5 - 5.1 mEq/L   Chloride 103 96 - 112 mEq/L   CO2 30 19 - 32 mEq/L   Glucose, Bld 88 70 - 99 mg/dL   BUN 15 6 - 23 mg/dL   Creatinine, Ser 0.62 0.40 - 1.20 mg/dL   Total Bilirubin 0.4 0.2 - 1.2 mg/dL   Alkaline Phosphatase 62 39 - 117 U/L   AST 20 0 - 37 U/L   ALT 19 0 - 35 U/L   Total Protein 7.1 6.0 - 8.3 g/dL   Albumin 4.2 3.5 - 5.2 g/dL   GFR 104.24 >60.00 mL/min   Calcium 9.1 8.4 - 10.5 mg/dL  CBC  Result Value Ref Range   WBC 4.0 4.0 -  10.5 K/uL   RBC 4.27 3.87 - 5.11 Mil/uL   Platelets 239.0 150.0 - 400.0 K/uL   Hemoglobin 12.3 12.0 - 15.0 g/dL   HCT 37.9 36.0 - 46.0 %   MCV 88.8 78.0 - 100.0 fl   MCHC 32.3 30.0 - 36.0 g/dL   RDW 12.5 11.5 - 15.5 %     Patient Active Problem List   Diagnosis Date Noted   Microcytic anemia 05/29/2015   Visit for preventive health examination 05/24/2015    Past Medical History:  Diagnosis Date   Microcytic anemia    Migraine    Venous insufficiency     Past Surgical History:  Procedure Laterality Date   CESAREAN SECTION  2010/2011    Social History   Tobacco Use   Smoking status: Never   Smokeless tobacco: Never  Vaping Use   Vaping Use: Never used  Substance Use Topics   Alcohol use: Not Currently    Alcohol/week: 0.0 standard drinks    Comment: Ocass-Wine    Drug use: No    Family History  Problem Relation Age of Onset   Heart disease Mother    Hypertension Sister     Hypertension Brother    Healthy Child        x 3 -- 68, 32 and 20 years old    No Known Allergies  Medication list has been reviewed and updated.  Current Outpatient Medications on File Prior to Visit  Medication Sig Dispense Refill   Ascorbic Acid (VITAMIN C) 100 MG tablet Take 100 mg by mouth daily as needed (Migraines).      ferrous sulfate 325 (65 FE) MG tablet Take 1 tablet (325 mg total) by mouth 2 (two) times daily with a meal. 60 tablet 0   hydrochlorothiazide (HYDRODIURIL) 25 MG tablet Take 0.5 tablets (12.5 mg total) by mouth daily. May increase to 1 tablet daily if directed by MD 30 tablet 5   Multiple Vitamins-Iron (MULTIVITAMIN/IRON PO) Take by mouth.     Specialty Vitamins Products (ECHINACEA C COMPLETE PO) Take by mouth.     SUMAtriptan (IMITREX) 100 MG tablet Take 0.5 tablets (50 mg total) by mouth once for 1 dose. May repeat in 2 hours if headache persists or recurs. 10 tablet 2   tranexamic acid (LYSTEDA) 650 MG TABS tablet Take 1,300 mg by mouth 3 (three) times daily. 2 tablets 3 times a day     No current facility-administered medications on file prior to visit.    Review of Systems:  As per HPI- otherwise negative.   Physical Examination: Vitals:   10/23/20 1314  BP: 122/80  Pulse: 73  Resp: 18  Temp: 98.3 F (36.8 C)  SpO2: 99%   Vitals:   10/23/20 1314  Weight: 114 lb 12.8 oz (52.1 kg)  Height: 4' 10"  (1.473 m)   Body mass index is 23.99 kg/m. Ideal Body Weight: Weight in (lb) to have BMI = 25: 119.4  GEN: no acute distress.  Petite build, normal weight.  Looks well HEENT: Atraumatic, Normocephalic.  Ears and Nose: No external deformity. CV: RRR, No M/G/R. No JVD. No thrill. No extra heart sounds. PULM: CTA B, no wheezes, crackles, rhonchi. No retractions. No resp. distress. No accessory muscle use. ABD: S, NT, ND, +BS. No rebound. No HSM. EXTR: No c/c/e PSYCH: Normally interactive. Conversant.  Left wrist: No swelling or heat.  There is a  small cyst over the dorsal wrist, but this is likely incidental.  Otherwise wrist exam  is currently normal  Assessment and Plan: Physical exam  Need for influenza vaccination - Plan: CANCELED: Flu Vaccine QUAD 6+ mos PF IM (Fluarix Quad PF)  Vitamin D deficiency - Plan: Cholecalciferol (VITAMIN D3) 1.25 MG (50000 UT) CAPS  Encounter for screening mammogram for malignant neoplasm of breast - Plan: MM 3D SCREEN BREAST BILATERAL  Benign essential HTN - Plan: hydrochlorothiazide (HYDRODIURIL) 25 MG tablet  Left wrist pain - Plan: Ambulatory referral to Orthopedic Surgery  Physical exam today.  Encouraged healthy diet and exercise routine Prescribed high-dose weekly vitamin D for 12 weeks, to be followed by over-the-counter daily supplementation She is still taking over-the-counter iron up to twice a day.  Check ferritin level Blood pressure well controlled, refilled medication Referral to Ortho to look at her wrist This visit occurred during the SARS-CoV-2 public health emergency.  Safety protocols were in place, including screening questions prior to the visit, additional usage of staff PPE, and extensive cleaning of exam room while observing appropriate contact time as indicated for disinfecting solutions.   Signed Lamar Blinks, MD

## 2020-10-23 ENCOUNTER — Other Ambulatory Visit: Payer: BC Managed Care – PPO

## 2020-10-23 ENCOUNTER — Other Ambulatory Visit: Payer: Self-pay

## 2020-10-23 ENCOUNTER — Ambulatory Visit (INDEPENDENT_AMBULATORY_CARE_PROVIDER_SITE_OTHER): Payer: BC Managed Care – PPO | Admitting: Family Medicine

## 2020-10-23 ENCOUNTER — Other Ambulatory Visit: Payer: Self-pay | Admitting: *Deleted

## 2020-10-23 VITALS — BP 122/80 | HR 73 | Temp 98.3°F | Resp 18 | Ht <= 58 in | Wt 114.8 lb

## 2020-10-23 DIAGNOSIS — Z0000 Encounter for general adult medical examination without abnormal findings: Secondary | ICD-10-CM | POA: Diagnosis not present

## 2020-10-23 DIAGNOSIS — I10 Essential (primary) hypertension: Secondary | ICD-10-CM

## 2020-10-23 DIAGNOSIS — E559 Vitamin D deficiency, unspecified: Secondary | ICD-10-CM | POA: Diagnosis not present

## 2020-10-23 DIAGNOSIS — Z23 Encounter for immunization: Secondary | ICD-10-CM | POA: Diagnosis not present

## 2020-10-23 DIAGNOSIS — M25532 Pain in left wrist: Secondary | ICD-10-CM

## 2020-10-23 DIAGNOSIS — D509 Iron deficiency anemia, unspecified: Secondary | ICD-10-CM | POA: Diagnosis not present

## 2020-10-23 DIAGNOSIS — Z1231 Encounter for screening mammogram for malignant neoplasm of breast: Secondary | ICD-10-CM | POA: Diagnosis not present

## 2020-10-23 MED ORDER — VITAMIN D3 1.25 MG (50000 UT) PO CAPS: ORAL_CAPSULE | ORAL | 0 refills | Status: AC

## 2020-10-23 MED ORDER — HYDROCHLOROTHIAZIDE 25 MG PO TABS: 12.5000 mg | ORAL_TABLET | Freq: Every day | ORAL | 3 refills | Status: DC

## 2020-10-23 NOTE — Progress Notes (Signed)
Faxed request to [facility] to send specimen from Friday to [facility] for additional ferritin testing. Future order placed for [facility] to release.

## 2020-10-24 ENCOUNTER — Encounter: Payer: Self-pay | Admitting: Family Medicine

## 2020-10-24 LAB — FERRITIN: Ferritin: 58 ng/mL (ref 16–232)

## 2020-10-25 ENCOUNTER — Encounter: Payer: BC Managed Care – PPO | Admitting: Family Medicine

## 2020-11-13 ENCOUNTER — Ambulatory Visit (HOSPITAL_BASED_OUTPATIENT_CLINIC_OR_DEPARTMENT_OTHER): Payer: BC Managed Care – PPO

## 2020-11-24 ENCOUNTER — Ambulatory Visit: Payer: BC Managed Care – PPO | Admitting: Orthopedic Surgery

## 2020-11-27 ENCOUNTER — Ambulatory Visit (HOSPITAL_BASED_OUTPATIENT_CLINIC_OR_DEPARTMENT_OTHER)
Admission: RE | Admit: 2020-11-27 | Discharge: 2020-11-27 | Disposition: A | Discharge status: Home / Self Care | Payer: BC Managed Care – PPO | Source: Ambulatory Visit | Attending: Family Medicine | Admitting: Family Medicine

## 2020-11-27 ENCOUNTER — Encounter (HOSPITAL_BASED_OUTPATIENT_CLINIC_OR_DEPARTMENT_OTHER): Payer: Self-pay

## 2020-11-27 ENCOUNTER — Other Ambulatory Visit: Payer: Self-pay

## 2020-11-27 DIAGNOSIS — Z1231 Encounter for screening mammogram for malignant neoplasm of breast: Secondary | ICD-10-CM | POA: Insufficient documentation

## 2021-06-11 ENCOUNTER — Telehealth: Payer: Self-pay | Admitting: Family Medicine

## 2021-06-11 NOTE — Telephone Encounter (Signed)
Pt is completely out of rx and is hoping she can go pick it up at a local pharm.  ? ?Medication: hydrochlorothiazide (HYDRODIURIL) 25 MG tablet ? ?Has the patient contacted their pharmacy? No. ? ?Preferred Pharmacy:  ? ?[facility] [address] [place], [place] - [address]  ? [address] [place] [address]  ?Phone:  [phone]  Fax:  [phone]  ?

## 2021-06-12 ENCOUNTER — Other Ambulatory Visit: Payer: Self-pay

## 2021-06-12 DIAGNOSIS — I10 Essential (primary) hypertension: Secondary | ICD-10-CM

## 2021-06-12 MED ORDER — HYDROCHLOROTHIAZIDE 25 MG PO TABS: 12.5000 mg | ORAL_TABLET | Freq: Every day | ORAL | 1 refills | Status: DC

## 2021-09-14 ENCOUNTER — Encounter: Payer: Self-pay | Admitting: Family Medicine

## 2021-09-14 ENCOUNTER — Telehealth: Payer: Self-pay | Admitting: Family Medicine

## 2021-09-14 DIAGNOSIS — E559 Vitamin D deficiency, unspecified: Secondary | ICD-10-CM

## 2021-09-14 DIAGNOSIS — Z1329 Encounter for screening for other suspected endocrine disorder: Secondary | ICD-10-CM

## 2021-09-14 DIAGNOSIS — R7303 Prediabetes: Secondary | ICD-10-CM | POA: Insufficient documentation

## 2021-09-14 DIAGNOSIS — Z13220 Encounter for screening for lipoid disorders: Secondary | ICD-10-CM

## 2021-09-14 DIAGNOSIS — Z862 Personal history of diseases of the blood and blood-forming organs and certain disorders involving the immune mechanism: Secondary | ICD-10-CM

## 2021-09-14 DIAGNOSIS — I10 Essential (primary) hypertension: Secondary | ICD-10-CM

## 2021-09-14 DIAGNOSIS — Z131 Encounter for screening for diabetes mellitus: Secondary | ICD-10-CM

## 2021-09-14 DIAGNOSIS — D509 Iron deficiency anemia, unspecified: Secondary | ICD-10-CM

## 2021-09-14 NOTE — Telephone Encounter (Signed)
Pt's husband is requesting labs prior to wife's cpe. Please advise.

## 2021-09-14 NOTE — Telephone Encounter (Signed)
Please advise- appt scheduled 8/24.

## 2021-09-14 NOTE — Progress Notes (Deleted)
[facility] [address] [place], [place] [address] [phone]  Date:  09/17/2021   Name:  Jade Kennedy   DOB:  1970/05/28   MRN:  [id number]  PCP:  Pearline Cables, MD    Chief Complaint: No chief complaint on file.   History of Present Illness:  Jade Kennedy is a 51 y.o. very pleasant female patient who presents with the following:  Pt seen today for a CPE Last seen by myself in September for a CPE She works at [employer] in [place], she is quite active at her job.  There is a lot of walking in her day-to-day routine History of vitamin D def and pre-diabetes    3 children ages 82, 15,11- her oldest is in college at [employer]  Hep C screening Shingrix Covid booster and flu shot due this fall  She is taking hctz      Patient Active Problem List   Diagnosis Date Noted   Microcytic anemia 05/29/2015   Visit for preventive health examination 05/24/2015    Past Medical History:  Diagnosis Date   Microcytic anemia    Migraine    Venous insufficiency     Past Surgical History:  Procedure Laterality Date   CESAREAN SECTION  2010/2011    Social History   Tobacco Use   Smoking status: Never   Smokeless tobacco: Never  Vaping Use   Vaping Use: Never used  Substance Use Topics   Alcohol use: Not Currently    Alcohol/week: 0.0 standard drinks of alcohol    Comment: Ocass-Wine    Drug use: No    Family History  Problem Relation Age of Onset   Heart disease Mother    Hypertension Sister    Hypertension Brother    Healthy Child        x 3 -- 39, 67 and 47 years old    No Known Allergies  Medication list has been reviewed and updated.  Current Outpatient Medications on File Prior to Visit  Medication Sig Dispense Refill   Ascorbic Acid (VITAMIN C) 100 MG tablet Take 100 mg by mouth daily as needed (Migraines).      Cholecalciferol (VITAMIN D3) 1.25 MG (50000 UT) CAPS Take 1 weekly  for 12 weeks 12 capsule 0   ferrous sulfate 325 (65 FE) MG tablet Take 1 tablet (325 mg total) by mouth 2 (two) times daily with a meal. 60 tablet 0   hydrochlorothiazide (HYDRODIURIL) 25 MG tablet Take 0.5 tablets (12.5 mg total) by mouth daily. 45 tablet 1   Multiple Vitamins-Iron (MULTIVITAMIN/IRON PO) Take by mouth.     Specialty Vitamins Products (ECHINACEA C COMPLETE PO) Take by mouth.     SUMAtriptan (IMITREX) 100 MG tablet Take 0.5 tablets (50 mg total) by mouth once for 1 dose. May repeat in 2 hours if headache persists or recurs. 10 tablet 2   tranexamic acid (LYSTEDA) 650 MG TABS tablet Take 1,300 mg by mouth 3 (three) times daily. 2 tablets 3 times a day     No current facility-administered medications on file prior to visit.    Review of Systems:  As per HPI- otherwise negative.   Physical Examination: There were no vitals filed for this visit. There were no vitals filed for this visit. There is no height or weight on file to calculate BMI. Ideal Body Weight:    GEN: no acute distress. HEENT: Atraumatic, Normocephalic.  Ears and Nose: No external deformity. CV: RRR, No M/G/R. No JVD. No thrill. No extra heart sounds. PULM: CTA B, no wheezes, crackles, rhonchi. No retractions. No resp. distress. No accessory muscle use. ABD: S, NT, ND, +BS. No rebound. No HSM. EXTR: No c/c/e PSYCH: Normally interactive. Conversant.    Assessment and Plan: ***  Signed Abbe Amsterdam, MD

## 2021-09-17 ENCOUNTER — Encounter: Payer: BC Managed Care – PPO | Admitting: Family Medicine

## 2021-09-18 NOTE — Patient Instructions (Signed)
Good to see you today!  I will be in touch with your labs asap  Recommend a flu shot this fall and covid booster if indicated  You can come in for your 2nd shingles vaccine as a nurse visit only in 2-6 months  I will send in your form once your labs come back

## 2021-09-18 NOTE — Progress Notes (Addendum)
[facility] [address] [place], [place] [address] [phone]  Date:  09/20/2021   Name:  Jade Kennedy   DOB:  1970/03/15   MRN:  [id number]  PCP:  Pearline Cables, MD    Chief Complaint: Annual Exam (Concerns/ questions: 1. Some nausea- she says the only thing different is that she took another dose of D3. L ear started bothering her yesterday. 3. Pt has not had her HCT in 1 week- would like to know if she even needs it. /Hep C screen due/Shingrix: none yet)   History of Present Illness:  Jade Kennedy is a 51 y.o. 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  She wonders if she might have earwax Patient Active Problem List   Diagnosis Date Noted   Prediabetes 09/14/2021   Microcytic anemia 05/29/2015   Visit for preventive health examination 05/24/2015    Past Medical History:  Diagnosis Date   Microcytic anemia    Migraine    Venous insufficiency     Past Surgical History:  Procedure Laterality Date   CESAREAN SECTION  2010/2011    Social History   Tobacco Use   Smoking status: Never   Smokeless tobacco: Never  Vaping Use   Vaping Use: Never used  Substance Use Topics   Alcohol use: Not Currently    Alcohol/week: 0.0 standard drinks of alcohol    Comment: Ocass-Wine    Drug  use: No    Family History  Problem Relation Age of Onset   Heart disease Mother    Hypertension Sister    Hypertension Brother    Healthy Child        x 3 -- 19, 77 and 43 years old    No Known Allergies  Medication list has been reviewed and updated.  Current Outpatient Medications on File Prior to Visit  Medication Sig Dispense Refill   Ascorbic Acid (VITAMIN C) 100 MG tablet Take 100 mg by mouth daily as needed (Migraines).      Cholecalciferol (VITAMIN D3) 1.25 MG (50000 UT) CAPS Take 1 weekly for 12 weeks 12 capsule 0   ferrous sulfate 325 (65 FE) MG tablet Take 1 tablet (325 mg total) by mouth 2 (two) times daily with a meal. 60 tablet 0   Multiple Vitamins-Iron (MULTIVITAMIN/IRON PO) Take by mouth.     Specialty Vitamins Products (ECHINACEA C COMPLETE PO) Take by mouth.     tranexamic acid (LYSTEDA) 650 MG TABS tablet Take 1,300 mg by mouth 3 (three) times daily. 2 tablets 3 times a day     SUMAtriptan (IMITREX) 100 MG tablet Take 0.5 tablets (50 mg total) by mouth once for 1 dose. May repeat in 2 hours if headache persists or recurs. 10 tablet 2   No current facility-administered  medications on file prior to visit.    Review of Systems:  As per HPI- otherwise negative.   Physical Examination: Vitals:   09/20/21 1518  BP: 122/82  Pulse: 62  Resp: 18  Temp: 97.8 F (36.6 C)  SpO2: 97%   Vitals:   09/20/21 1518  Weight: 121 lb 3.2 oz (55 kg)  Height: 4\' 10"  (1.473 m)   Body mass index is 25.33 kg/m. Ideal Body Weight: Weight in (lb) to have BMI = 25: 119.4  GEN: no acute distress.  Petite build, normal weight, looks well HEENT: Atraumatic, Normocephalic.  Bilateral TM wnl, oropharynx normal.  PEERL,EOMI.   Ears and Nose: No external deformity. CV: RRR, No M/G/R. No JVD. No thrill. No extra heart sounds. PULM: CTA B, no wheezes, crackles, rhonchi. No retractions. No resp. distress. No accessory muscle use. ABD: S, NT, ND, +BS. No rebound. No HSM. EXTR: No  c/c/e PSYCH: Normally interactive. Conversant.  Significant buildup of cerumen in bilateral ears  Results for orders placed or performed in visit on 09/20/21  CBC  Result Value Ref Range   WBC 8.6 4.0 - 10.5 K/uL   RBC 4.35 3.87 - 5.11 Mil/uL   Platelets 243.0 150.0 - 400.0 K/uL   Hemoglobin 12.5 12.0 - 15.0 g/dL   HCT 09/22/21 53.9 - 76.7 %   MCV 89.6 78.0 - 100.0 fl   MCHC 32.2 30.0 - 36.0 g/dL   RDW 34.1 93.7 - 90.2 %  Comprehensive metabolic panel  Result Value Ref Range   Sodium 143 135 - 145 mEq/L   Potassium 3.9 3.5 - 5.1 mEq/L   Chloride 103 96 - 112 mEq/L   CO2 31 19 - 32 mEq/L   Glucose, Bld 87 70 - 99 mg/dL   BUN 14 6 - 23 mg/dL   Creatinine, Ser 40.9 0.40 - 1.20 mg/dL   Total Bilirubin 0.6 0.2 - 1.2 mg/dL   Alkaline Phosphatase 95 39 - 117 U/L   AST 53 (H) 0 - 37 U/L   ALT 97 (H) 0 - 35 U/L   Total Protein 7.1 6.0 - 8.3 g/dL   Albumin 4.3 3.5 - 5.2 g/dL   GFR 7.35 329.92 mL/min   Calcium 9.2 8.4 - 10.5 mg/dL  Hemoglobin >42.68  Result Value Ref Range   Hgb A1c MFr Bld 6.3 4.6 - 6.5 %  Lipid panel  Result Value Ref Range   Cholesterol 179 0 - 200 mg/dL   Triglycerides T4H 0.0 - 149.0 mg/dL   HDL 96.2 22.97 mg/dL   VLDL >98.92 0.0 - 11.9 mg/dL   LDL Cholesterol 89 0 - 99 mg/dL   Total CHOL/HDL Ratio 3    NonHDL 107.30   TSH  Result Value Ref Range   TSH 0.81 0.35 - 5.50 uIU/mL  VITAMIN D 25 Hydroxy (Vit-D Deficiency, Fractures)  Result Value Ref Range   VITD 26.54 (L) 30.00 - 100.00 ng/mL  Hepatitis C Antibody  Result Value Ref Range   Hepatitis C Ab NON-REACTIVE NON-REACTIVE  Ferritin  Result Value Ref Range   Ferritin 57.5 10.0 - 291.0 ng/mL  POCT urine pregnancy  Result Value Ref Range   Preg Test, Ur Negative Negative    Verbal consent obtained.  Ear wax was removed from both ears with warm water irrigation and wax remover drops.  There was a large buildup of very difficult to remove wax, procedure took approximately 45 minutes Patient tolerated  well, no complications and she felt better, hearing improved after  procedure Assessment and Plan: Physical exam  Prediabetes - Plan: Comprehensive metabolic panel, Hemoglobin A1c  Screening for thyroid disorder - Plan: TSH  Benign essential HTN - Plan: CBC, Comprehensive metabolic panel  Vitamin D deficiency - Plan: VITAMIN D 25 Hydroxy (Vit-D Deficiency, Fractures)  Screening for hyperlipidemia - Plan: Lipid panel  Encounter for hepatitis C screening test for low risk patient - Plan: Hepatitis C Antibody  Iron deficiency - Plan: Ferritin  Immunization due - Plan: Zoster Recombinant (Shingrix )  Amenorrhea - Plan: POCT urine pregnancy  Bilateral impacted cerumen   Physical exam today-encouraged healthy diet and exercise routine Will plan further follow- up pending labs. We will send insurance form when labs return Start Shingrix Signed Abbe Amsterdam, MD  Addnd 8/25- received labs, message to pt and completed insurance form  Results for orders placed or performed in visit on 09/20/21  CBC  Result Value Ref Range   WBC 8.6 4.0 - 10.5 K/uL   RBC 4.35 3.87 - 5.11 Mil/uL   Platelets 243.0 150.0 - 400.0 K/uL   Hemoglobin 12.5 12.0 - 15.0 g/dL   HCT 45.8 09.9 - 83.3 %   MCV 89.6 78.0 - 100.0 fl   MCHC 32.2 30.0 - 36.0 g/dL   RDW 82.5 05.3 - 97.6 %  Comprehensive metabolic panel  Result Value Ref Range   Sodium 143 135 - 145 mEq/L   Potassium 3.9 3.5 - 5.1 mEq/L   Chloride 103 96 - 112 mEq/L   CO2 31 19 - 32 mEq/L   Glucose, Bld 87 70 - 99 mg/dL   BUN 14 6 - 23 mg/dL   Creatinine, Ser 7.34 0.40 - 1.20 mg/dL   Total Bilirubin 0.6 0.2 - 1.2 mg/dL   Alkaline Phosphatase 95 39 - 117 U/L   AST 53 (H) 0 - 37 U/L   ALT 97 (H) 0 - 35 U/L   Total Protein 7.1 6.0 - 8.3 g/dL   Albumin 4.3 3.5 - 5.2 g/dL   GFR 193.79 >02.40 mL/min   Calcium 9.2 8.4 - 10.5 mg/dL  Hemoglobin X7D  Result Value Ref Range   Hgb A1c MFr Bld 6.3 4.6 - 6.5 %  Lipid panel  Result Value Ref  Range   Cholesterol 179 0 - 200 mg/dL   Triglycerides 53.2 0.0 - 149.0 mg/dL   HDL 99.24 >26.83 mg/dL   VLDL 41.9 0.0 - 62.2 mg/dL   LDL Cholesterol 89 0 - 99 mg/dL   Total CHOL/HDL Ratio 3    NonHDL 107.30   TSH  Result Value Ref Range   TSH 0.81 0.35 - 5.50 uIU/mL  VITAMIN D 25 Hydroxy (Vit-D Deficiency, Fractures)  Result Value Ref Range   VITD 26.54 (L) 30.00 - 100.00 ng/mL  Hepatitis C Antibody  Result Value Ref Range   Hepatitis C Ab NON-REACTIVE NON-REACTIVE  Ferritin  Result Value Ref Range   Ferritin 57.5 10.0 - 291.0 ng/mL  POCT urine pregnancy  Result Value Ref Range   Preg Test, Ur Negative Negative

## 2021-09-20 ENCOUNTER — Ambulatory Visit (INDEPENDENT_AMBULATORY_CARE_PROVIDER_SITE_OTHER): Payer: BC Managed Care – PPO | Admitting: Family Medicine

## 2021-09-20 VITALS — BP 122/82 | HR 62 | Temp 97.8°F | Resp 18 | Ht <= 58 in | Wt 121.2 lb

## 2021-09-20 DIAGNOSIS — Z1329 Encounter for screening for other suspected endocrine disorder: Secondary | ICD-10-CM | POA: Diagnosis not present

## 2021-09-20 DIAGNOSIS — I10 Essential (primary) hypertension: Secondary | ICD-10-CM | POA: Diagnosis not present

## 2021-09-20 DIAGNOSIS — E559 Vitamin D deficiency, unspecified: Secondary | ICD-10-CM

## 2021-09-20 DIAGNOSIS — Z1159 Encounter for screening for other viral diseases: Secondary | ICD-10-CM | POA: Diagnosis not present

## 2021-09-20 DIAGNOSIS — H6123 Impacted cerumen, bilateral: Secondary | ICD-10-CM

## 2021-09-20 DIAGNOSIS — Z0000 Encounter for general adult medical examination without abnormal findings: Secondary | ICD-10-CM

## 2021-09-20 DIAGNOSIS — N912 Amenorrhea, unspecified: Secondary | ICD-10-CM | POA: Diagnosis not present

## 2021-09-20 DIAGNOSIS — Z13220 Encounter for screening for lipoid disorders: Secondary | ICD-10-CM | POA: Diagnosis not present

## 2021-09-20 DIAGNOSIS — Z23 Encounter for immunization: Secondary | ICD-10-CM | POA: Diagnosis not present

## 2021-09-20 DIAGNOSIS — E611 Iron deficiency: Secondary | ICD-10-CM | POA: Diagnosis not present

## 2021-09-20 DIAGNOSIS — R7303 Prediabetes: Secondary | ICD-10-CM

## 2021-09-20 DIAGNOSIS — Z131 Encounter for screening for diabetes mellitus: Secondary | ICD-10-CM

## 2021-09-20 LAB — POCT URINE PREGNANCY: Preg Test, Ur: NEGATIVE

## 2021-09-21 ENCOUNTER — Encounter: Payer: Self-pay | Admitting: Family Medicine

## 2021-09-21 LAB — LIPID PANEL
Cholesterol: 179 mg/dL (ref 0–200)
HDL: 71.4 mg/dL (ref >39.00)
LDL Cholesterol: 89 mg/dL (ref 0–99)
NonHDL: 107.3
Total CHOL/HDL Ratio: 3
Triglycerides: 91 mg/dL (ref 0.0–149.0)
VLDL: 18.2 mg/dL (ref 0.0–40.0)

## 2021-09-21 LAB — CBC
HCT: 39 % (ref 36.0–46.0)
Hemoglobin: 12.5 g/dL (ref 12.0–15.0)
MCHC: 32.2 g/dL (ref 30.0–36.0)
MCV: 89.6 fl (ref 78.0–100.0)
Platelets: 243 10*3/uL (ref 150.0–400.0)
RBC: 4.35 Mil/uL (ref 3.87–5.11)
RDW: 12.8 % (ref 11.5–15.5)
WBC: 8.6 10*3/uL (ref 4.0–10.5)

## 2021-09-21 LAB — FERRITIN: Ferritin: 57.5 ng/mL (ref 10.0–291.0)

## 2021-09-21 LAB — COMPREHENSIVE METABOLIC PANEL
ALT: 97 U/L — ABNORMAL HIGH (ref 0–35)
AST: 53 U/L — ABNORMAL HIGH (ref 0–37)
Albumin: 4.3 g/dL (ref 3.5–5.2)
Alkaline Phosphatase: 95 U/L (ref 39–117)
BUN: 14 mg/dL (ref 6–23)
CO2: 31 mEq/L (ref 19–32)
Calcium: 9.2 mg/dL (ref 8.4–10.5)
Chloride: 103 mEq/L (ref 96–112)
Creatinine, Ser: 0.63 mg/dL (ref 0.40–1.20)
GFR: 103.17 mL/min (ref >60.00)
Glucose, Bld: 87 mg/dL (ref 70–99)
Potassium: 3.9 mEq/L (ref 3.5–5.1)
Sodium: 143 mEq/L (ref 135–145)
Total Bilirubin: 0.6 mg/dL (ref 0.2–1.2)
Total Protein: 7.1 g/dL (ref 6.0–8.3)

## 2021-09-21 LAB — VITAMIN D 25 HYDROXY (VIT D DEFICIENCY, FRACTURES): VITD: 26.54 ng/mL — ABNORMAL LOW (ref 30.00–100.00)

## 2021-09-21 LAB — HEMOGLOBIN A1C: Hgb A1c MFr Bld: 6.3 % (ref 4.6–6.5)

## 2021-09-21 LAB — HEPATITIS C ANTIBODY: Hepatitis C Ab: NONREACTIVE

## 2021-09-21 LAB — TSH: TSH: 0.81 u[IU]/mL (ref 0.35–5.50)

## 2021-09-23 ENCOUNTER — Other Ambulatory Visit: Payer: Self-pay | Admitting: Family Medicine

## 2021-09-23 DIAGNOSIS — R7401 Elevation of levels of liver transaminase levels: Secondary | ICD-10-CM

## 2021-09-25 NOTE — Telephone Encounter (Signed)
Scheduled

## 2021-09-28 ENCOUNTER — Other Ambulatory Visit (INDEPENDENT_AMBULATORY_CARE_PROVIDER_SITE_OTHER): Payer: BC Managed Care – PPO

## 2021-09-28 DIAGNOSIS — R7401 Elevation of levels of liver transaminase levels: Secondary | ICD-10-CM | POA: Diagnosis not present

## 2021-09-28 LAB — HEPATIC FUNCTION PANEL
ALT: 68 U/L — ABNORMAL HIGH (ref 0–35)
AST: 24 U/L (ref 0–37)
Albumin: 4 g/dL (ref 3.5–5.2)
Alkaline Phosphatase: 106 U/L (ref 39–117)
Bilirubin, Direct: 0.1 mg/dL (ref 0.0–0.3)
Total Bilirubin: 0.4 mg/dL (ref 0.2–1.2)
Total Protein: 7.5 g/dL (ref 6.0–8.3)

## 2021-09-29 ENCOUNTER — Encounter: Payer: Self-pay | Admitting: Family Medicine

## 2021-10-01 ENCOUNTER — Encounter: Payer: Self-pay | Admitting: Family Medicine

## 2021-10-01 LAB — HEPATITIS PANEL, ACUTE
Hep A IgM: NONREACTIVE
Hep B C IgM: NONREACTIVE
Hepatitis B Surface Ag: NONREACTIVE
Hepatitis C Ab: NONREACTIVE

## 2021-10-02 ENCOUNTER — Telehealth (HOSPITAL_BASED_OUTPATIENT_CLINIC_OR_DEPARTMENT_OTHER): Payer: Self-pay

## 2021-10-05 ENCOUNTER — Encounter: Payer: Self-pay | Admitting: Family Medicine

## 2021-10-05 ENCOUNTER — Ambulatory Visit (HOSPITAL_BASED_OUTPATIENT_CLINIC_OR_DEPARTMENT_OTHER)
Admission: RE | Admit: 2021-10-05 | Discharge: 2021-10-05 | Disposition: A | Discharge status: Home / Self Care | Payer: BC Managed Care – PPO | Source: Ambulatory Visit | Attending: Family Medicine | Admitting: Family Medicine

## 2021-10-05 DIAGNOSIS — R7401 Elevation of levels of liver transaminase levels: Secondary | ICD-10-CM | POA: Insufficient documentation

## 2021-10-05 DIAGNOSIS — R748 Abnormal levels of other serum enzymes: Secondary | ICD-10-CM | POA: Diagnosis not present

## 2021-10-05 DIAGNOSIS — R7989 Other specified abnormal findings of blood chemistry: Secondary | ICD-10-CM | POA: Diagnosis not present

## 2021-11-12 NOTE — Addendum Note (Signed)
Addended by: Lamar Blinks C on: 11/12/2021 12:53 PM   Modules accepted: Orders

## 2022-03-18 ENCOUNTER — Encounter: Payer: Self-pay | Admitting: Family Medicine

## 2022-06-07 ENCOUNTER — Other Ambulatory Visit: Payer: Self-pay

## 2022-06-07 ENCOUNTER — Telehealth: Payer: Self-pay | Admitting: Family Medicine

## 2022-06-07 MED ORDER — HYDROCHLOROTHIAZIDE 25 MG PO TABS: 12.5000 mg | ORAL_TABLET | Freq: Every day | ORAL | 0 refills | Status: DC

## 2022-06-07 NOTE — Telephone Encounter (Signed)
Refill has been sent in.  

## 2022-06-07 NOTE — Telephone Encounter (Signed)
Patient called and would like a med refill for 2 months of Hydrochlorothiazide 25 mg, she [place] be flying out country. Please call

## 2022-09-02 ENCOUNTER — Encounter: Payer: Self-pay | Admitting: Family Medicine

## 2022-09-02 NOTE — Progress Notes (Unsigned)
[facility] [address] [place], [place] [address] [phone]  Date:  09/04/2022   Name:  Jade Kennedy   DOB:  February 16, 1970   MRN:  [id number]  PCP:  Pearline Cables, MD    Chief Complaint: No chief complaint on file.   History of Present Illness:  Jade Kennedy is a 52 y.o. very pleasant female patient who presents with the following:  Patient seen today for physical exam- history of HTN, vit D def, prediabetes, transaminitis Most recent visit with myself was about 1 year ago Works at [employer]	[place]  3 children ages 57, 6, 12-her oldest is in college at [employer]  At our last visit she was experiencing menstrual irregularity, likely related to menopause Pap smear- can update if desired  Mammogram- 10/22 Colon cancer screening- Cologard due 10/24  At her last visit we noted mild transaminitis-got a right upper quadrant ultrasound and repeated her liver function test in September at which point they were improved, though ALT is still mildly high Right upper quadrant ultrasound negative, hepatitis screening labs negative Patient Active Problem List   Diagnosis Date Noted   Prediabetes 09/14/2021   Microcytic anemia 05/29/2015   Visit for preventive health examination 05/24/2015    Past Medical History:  Diagnosis Date   Microcytic anemia    Migraine    Venous insufficiency     Past Surgical History:  Procedure Laterality Date   CESAREAN SECTION  2010/2011    Social History   Tobacco Use   Smoking status: Never   Smokeless tobacco: Never  Vaping Use   Vaping status: Never Used  Substance Use Topics   Alcohol use: Not Currently    Alcohol/week: 0.0 standard drinks of alcohol    Comment: Ocass-Wine    Drug use: No    Family History  Problem Relation Age of Onset   Heart disease Mother    Hypertension Sister    Hypertension Brother    Healthy Child        x 3 -- 67, 7 and 5 years  old    No Known Allergies  Medication list has been reviewed and updated.  Current Outpatient Medications on File Prior to Visit  Medication Sig Dispense Refill   Ascorbic Acid (VITAMIN C) 100 MG tablet Take 100 mg by mouth daily as needed (Migraines).      Cholecalciferol (VITAMIN D3) 1.25 MG (50000 UT) CAPS Take 1 weekly for 12 weeks 12 capsule 0   ferrous sulfate 325 (65 FE) MG tablet Take 1 tablet (325 mg total) by mouth 2 (two) times daily with a meal. 60 tablet 0   hydrochlorothiazide (HYDRODIURIL) 25 MG tablet Take 0.5 tablets (12.5 mg total) by mouth daily. 90 tablet 0   Multiple Vitamins-Iron (MULTIVITAMIN/IRON PO) Take by mouth.     Specialty Vitamins Products (ECHINACEA C COMPLETE PO) Take by mouth.     SUMAtriptan (IMITREX) 100 MG tablet Take 0.5 tablets (50 mg total) by mouth once for 1 dose. May repeat in 2 hours if headache persists or recurs. 10 tablet 2   tranexamic acid (LYSTEDA) 650 MG TABS tablet Take 1,300 mg by mouth 3 (three) times daily. 2 tablets 3 times a day     No current facility-administered medications on file prior to visit.    Review of Systems:  As per HPI- otherwise negative.   Physical Examination: There were no vitals filed for this visit.  There were no vitals filed for this visit. There is no height or weight on file to calculate BMI. Ideal Body Weight:    GEN: no acute distress. HEENT: Atraumatic, Normocephalic.  Ears and Nose: No external deformity. CV: RRR, No M/G/R. No JVD. No thrill. No extra heart sounds. PULM: CTA B, no wheezes, crackles, rhonchi. No retractions. No resp. distress. No accessory muscle use. ABD: S, NT, ND, +BS. No rebound. No HSM. EXTR: No c/c/e PSYCH: Normally interactive. Conversant.    Assessment and Plan: ***  Signed Abbe Amsterdam, MD

## 2022-09-04 ENCOUNTER — Ambulatory Visit (INDEPENDENT_AMBULATORY_CARE_PROVIDER_SITE_OTHER): Payer: BC Managed Care – PPO | Admitting: Family Medicine

## 2022-09-04 ENCOUNTER — Other Ambulatory Visit (HOSPITAL_COMMUNITY)
Admission: RE | Admit: 2022-09-04 | Discharge: 2022-09-04 | Disposition: A | Discharge status: Home / Self Care | Payer: BC Managed Care – PPO | Source: Ambulatory Visit | Attending: Family Medicine | Admitting: Family Medicine

## 2022-09-04 VITALS — BP 122/80 | HR 71 | Temp 97.7°F | Resp 18 | Ht <= 58 in | Wt 122.8 lb

## 2022-09-04 DIAGNOSIS — E611 Iron deficiency: Secondary | ICD-10-CM | POA: Diagnosis not present

## 2022-09-04 DIAGNOSIS — I10 Essential (primary) hypertension: Secondary | ICD-10-CM

## 2022-09-04 DIAGNOSIS — R7303 Prediabetes: Secondary | ICD-10-CM | POA: Diagnosis not present

## 2022-09-04 DIAGNOSIS — M25561 Pain in right knee: Secondary | ICD-10-CM

## 2022-09-04 DIAGNOSIS — Z0000 Encounter for general adult medical examination without abnormal findings: Secondary | ICD-10-CM | POA: Diagnosis not present

## 2022-09-04 DIAGNOSIS — Z13220 Encounter for screening for lipoid disorders: Secondary | ICD-10-CM

## 2022-09-04 DIAGNOSIS — E559 Vitamin D deficiency, unspecified: Secondary | ICD-10-CM

## 2022-09-04 DIAGNOSIS — Z1231 Encounter for screening mammogram for malignant neoplasm of breast: Secondary | ICD-10-CM

## 2022-09-04 DIAGNOSIS — Z1329 Encounter for screening for other suspected endocrine disorder: Secondary | ICD-10-CM | POA: Diagnosis not present

## 2022-09-04 DIAGNOSIS — Z124 Encounter for screening for malignant neoplasm of cervix: Secondary | ICD-10-CM

## 2022-09-04 DIAGNOSIS — G8929 Other chronic pain: Secondary | ICD-10-CM

## 2022-09-04 DIAGNOSIS — R7401 Elevation of levels of liver transaminase levels: Secondary | ICD-10-CM | POA: Diagnosis not present

## 2022-09-04 NOTE — Patient Instructions (Addendum)
It was good to see you today!  I will be in touch with your labs and pap asap  Recommend covid booster and flu shot this fall 2nd shingles vaccine to be done this week I ordered your mammogram- please schedule soon   Your Cologuard test will be due for update in October- please send me a message this fall and I will order for you   Your BP looks good- ok to try staying off the BP meds for now and see how you do!

## 2022-09-05 ENCOUNTER — Encounter: Payer: Self-pay | Admitting: Family Medicine

## 2022-09-05 LAB — FERRITIN: Ferritin: 105.1 ng/mL (ref 10.0–291.0)

## 2022-09-05 LAB — COMPREHENSIVE METABOLIC PANEL WITH GFR
ALT: 34 U/L (ref 0–35)
AST: 34 U/L (ref 0–37)
Albumin: 4.2 g/dL (ref 3.5–5.2)
Alkaline Phosphatase: 92 U/L (ref 39–117)
BUN: 16 mg/dL (ref 6–23)
CO2: 29 meq/L (ref 19–32)
Calcium: 9.2 mg/dL (ref 8.4–10.5)
Chloride: 102 meq/L (ref 96–112)
Creatinine, Ser: 0.63 mg/dL (ref 0.40–1.20)
GFR: 102.48 mL/min
Glucose, Bld: 84 mg/dL (ref 70–99)
Potassium: 3.9 meq/L (ref 3.5–5.1)
Sodium: 138 meq/L (ref 135–145)
Total Bilirubin: 0.4 mg/dL (ref 0.2–1.2)
Total Protein: 7.4 g/dL (ref 6.0–8.3)

## 2022-09-05 LAB — HEMOGLOBIN A1C: Hgb A1c MFr Bld: 5.9 % (ref 4.6–6.5)

## 2022-09-05 NOTE — Progress Notes (Signed)
Rand Gowan is a 52 y.o. female presents to the office today for Shingrix #2 injection, per physician's orders. Original order: 09/20/2021 Shingrix 0.2 mL, was administered L Deltoid today. Patient tolerated injection. Patient due for follow up labs/provider appt: No.  Patient next injection due: n/a   DOD: Alric Seton, Feliberto Harts

## 2022-09-06 ENCOUNTER — Ambulatory Visit (INDEPENDENT_AMBULATORY_CARE_PROVIDER_SITE_OTHER): Payer: BC Managed Care – PPO

## 2022-09-06 ENCOUNTER — Encounter: Payer: Self-pay | Admitting: Family Medicine

## 2022-09-06 DIAGNOSIS — Z23 Encounter for immunization: Secondary | ICD-10-CM | POA: Diagnosis not present

## 2022-09-12 ENCOUNTER — Ambulatory Visit (HOSPITAL_BASED_OUTPATIENT_CLINIC_OR_DEPARTMENT_OTHER)
Admission: RE | Admit: 2022-09-12 | Discharge: 2022-09-12 | Disposition: A | Discharge status: Home / Self Care | Payer: BC Managed Care – PPO | Source: Ambulatory Visit | Attending: Family Medicine | Admitting: Family Medicine

## 2022-09-12 DIAGNOSIS — Z1231 Encounter for screening mammogram for malignant neoplasm of breast: Secondary | ICD-10-CM | POA: Diagnosis not present

## 2022-09-24 ENCOUNTER — Encounter: Payer: Self-pay | Admitting: Family Medicine

## 2022-10-02 ENCOUNTER — Ambulatory Visit (HOSPITAL_BASED_OUTPATIENT_CLINIC_OR_DEPARTMENT_OTHER)
Admission: RE | Admit: 2022-10-02 | Discharge: 2022-10-02 | Disposition: A | Discharge status: Home / Self Care | Payer: BC Managed Care – PPO | Source: Ambulatory Visit | Attending: Sports Medicine | Admitting: Sports Medicine

## 2022-10-02 ENCOUNTER — Ambulatory Visit (INDEPENDENT_AMBULATORY_CARE_PROVIDER_SITE_OTHER): Payer: BC Managed Care – PPO | Admitting: Sports Medicine

## 2022-10-02 ENCOUNTER — Encounter: Payer: Self-pay | Admitting: Sports Medicine

## 2022-10-02 VITALS — BP 118/72 | Ht <= 58 in | Wt 117.0 lb

## 2022-10-02 DIAGNOSIS — M25561 Pain in right knee: Secondary | ICD-10-CM

## 2022-10-02 DIAGNOSIS — M1711 Unilateral primary osteoarthritis, right knee: Secondary | ICD-10-CM | POA: Diagnosis not present

## 2022-10-02 MED ORDER — MELOXICAM 15 MG PO TABS: ORAL_TABLET | ORAL | 0 refills | Status: AC

## 2022-10-02 NOTE — Progress Notes (Signed)
   Subjective:    Patient ID: Jade Kennedy, female    DOB: 1970/09/17, 52 y.o.   MRN: [id number]  HPI chief complaint: Right knee pain  Patient is a very pleasant 52 year old female that presents today complaining of several months of diffuse right knee pain.  She denies any injury.  Pain is present with prolonged standing or prolonged sitting.  She also notices an inability to completely flex the knee.  She denies any swelling.  She has not tried any treatment herself.  She denies problems with this knee in the past.  No prior knee surgeries.  Pain does not radiate.  No mechanical symptoms.  Past medical history reviewed Medications reviewed Allergies reviewed   Review of Systems As above    Objective:   Physical Exam  Well-developed, well-nourished.  No acute distress  Right knee: Range of motion is 0 to 120 degrees compared to 0 to 140 degrees on the left.  There is a trace effusion present.  Slight tenderness to palpation along the medial joint line and mild pain with Thessaly's.  No tenderness along the lateral joint line.  No patellofemoral crepitus.  Knee is stable to valgus and varus stressing.  Negative anterior drawer, negative posterior drawer.  Neurovascularly intact distally.      Assessment & Plan:   Knee pain likely secondary to DJD versus degenerative meniscal tear  X-rays of the right knee including AP standing, lateral, and sunrise.  Patient will start meloxicam 15 mg daily for 5 days then as needed.  She will take this with food and is cautioned about GI upset.  I recommended that she purchase a compression sleeve and wear it when active.  She will also start a home exercise program consisting of VMO and adductor strengthening.  I will message her through MyChart with her x-ray results when available.  She will follow-up with me again in 4 weeks.  This note was dictated using Dragon naturally speaking software and may contain errors in syntax, spelling, or content  which have not been identified prior to signing this note.

## 2022-10-16 ENCOUNTER — Encounter: Payer: Self-pay | Admitting: Sports Medicine

## 2022-10-30 ENCOUNTER — Ambulatory Visit: Payer: BC Managed Care – PPO | Admitting: Sports Medicine

## 2022-11-07 IMAGING — MG MM DIGITAL SCREENING BILAT W/ TOMO AND CAD
8 series · 9 of 24 positions shown · non-contrast
Comparison: Previous exam(s).

CLINICAL DATA: Screening.

EXAM:
DIGITAL SCREENING BILATERAL MAMMOGRAM WITH TOMOSYNTHESIS AND CAD
TECHNIQUE: Bilateral screening digital craniocaudal and mediolateral oblique
mammograms were obtained. Bilateral screening digital breast
tomosynthesis was performed. The images were evaluated with
computer-aided detection.

[R CC synth-2D]
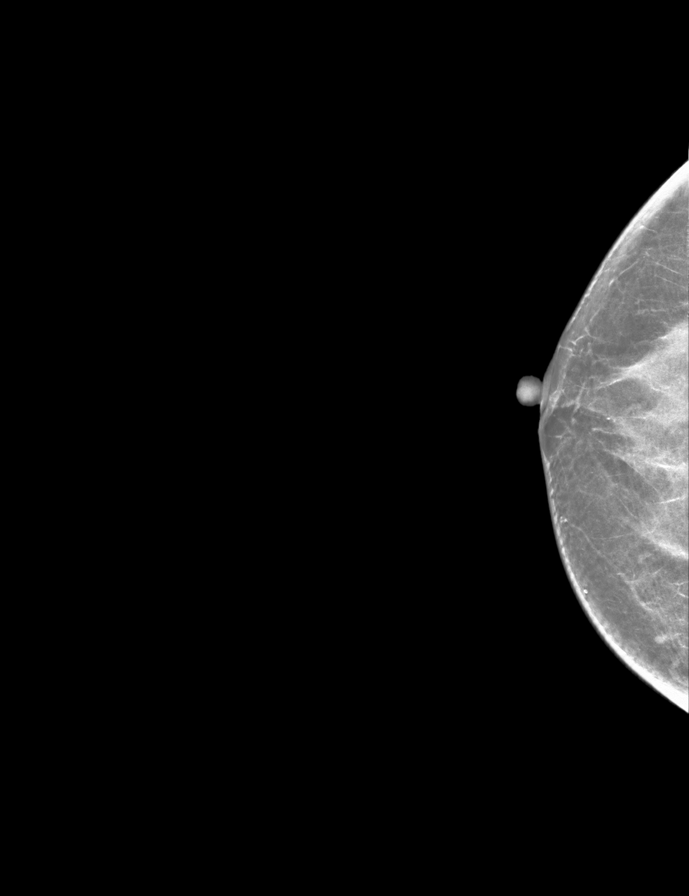

[L CC synth-2D]
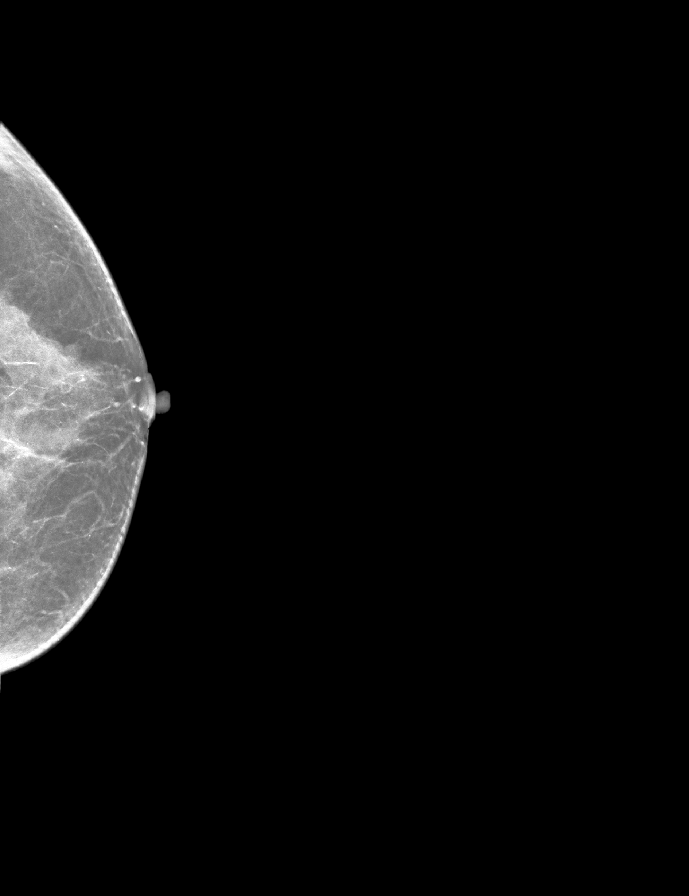

[L MLO synth-2D]
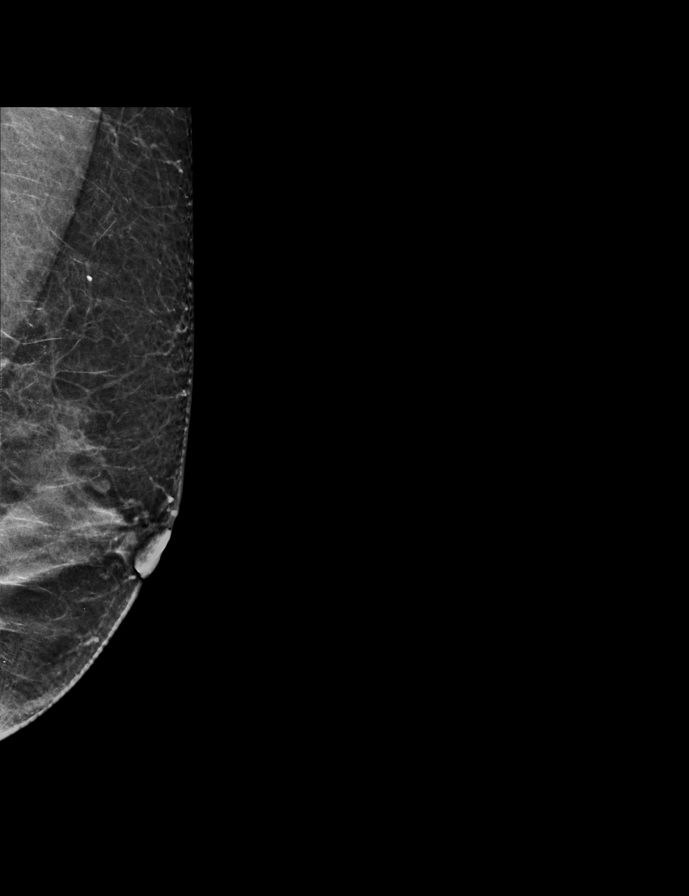

[R MLO synth-2D]
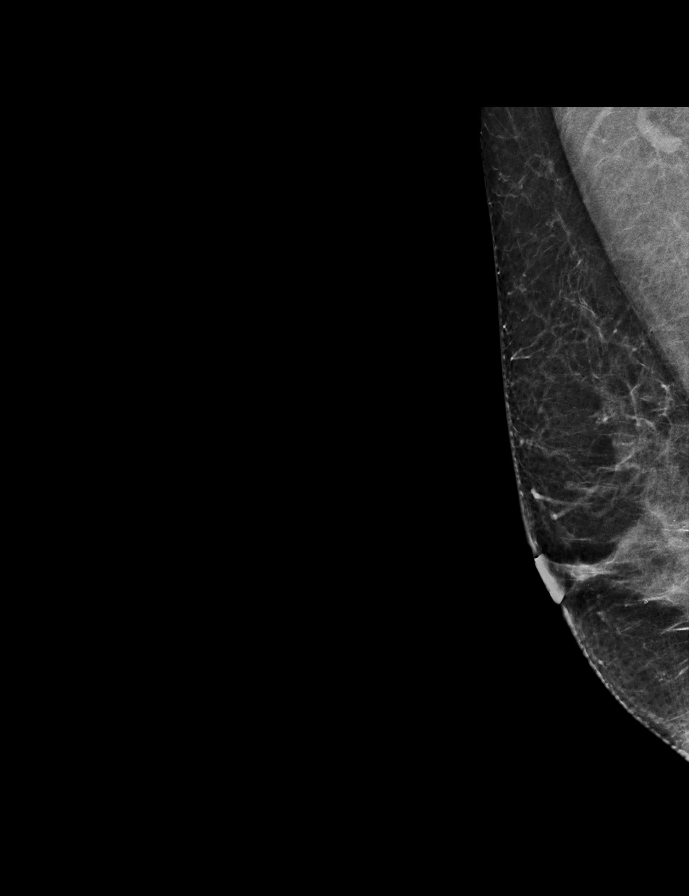

[L MLO tomo · 2 of 49 frames shown]
[frame 16/49]
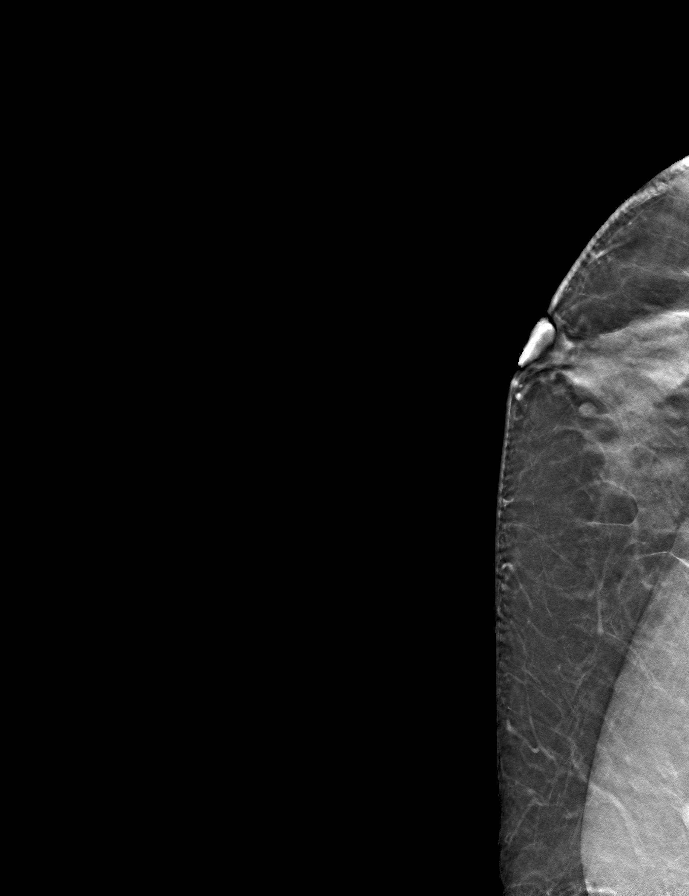
[frame 25/49]
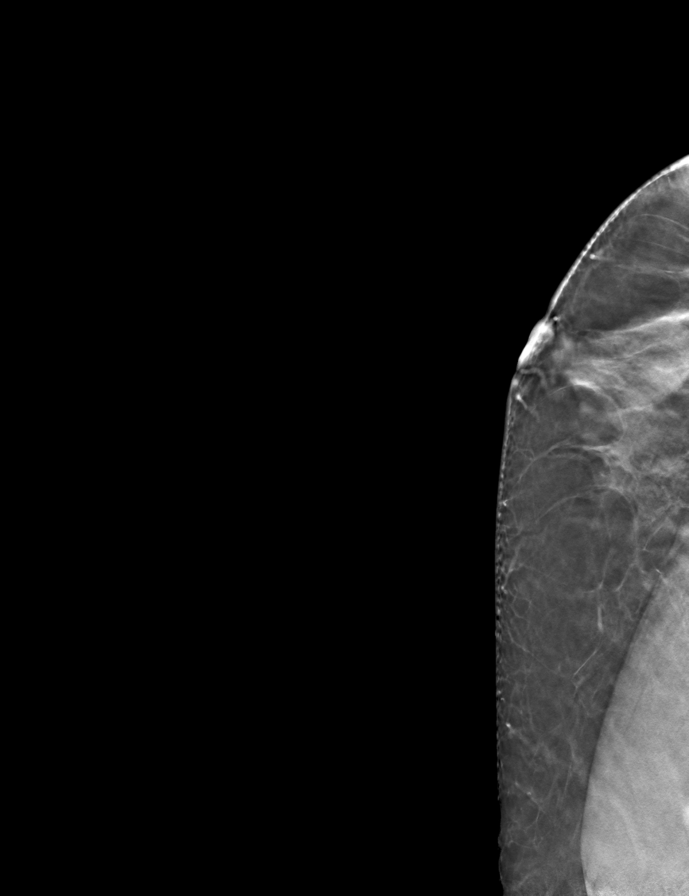

[L CC tomo · tomo slice 27/52.0]
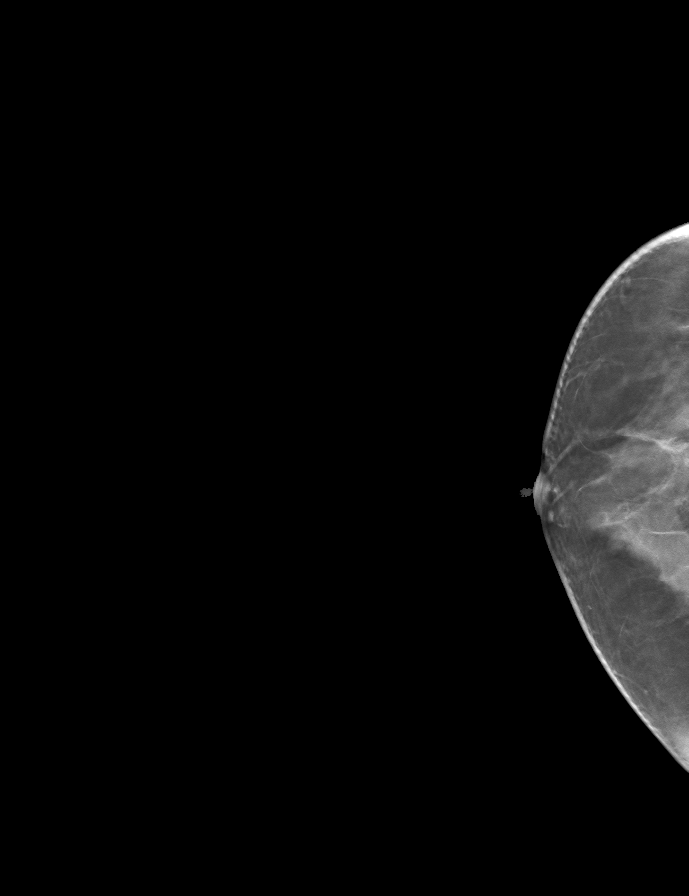

[R CC tomo · tomo slice 27/53.0]
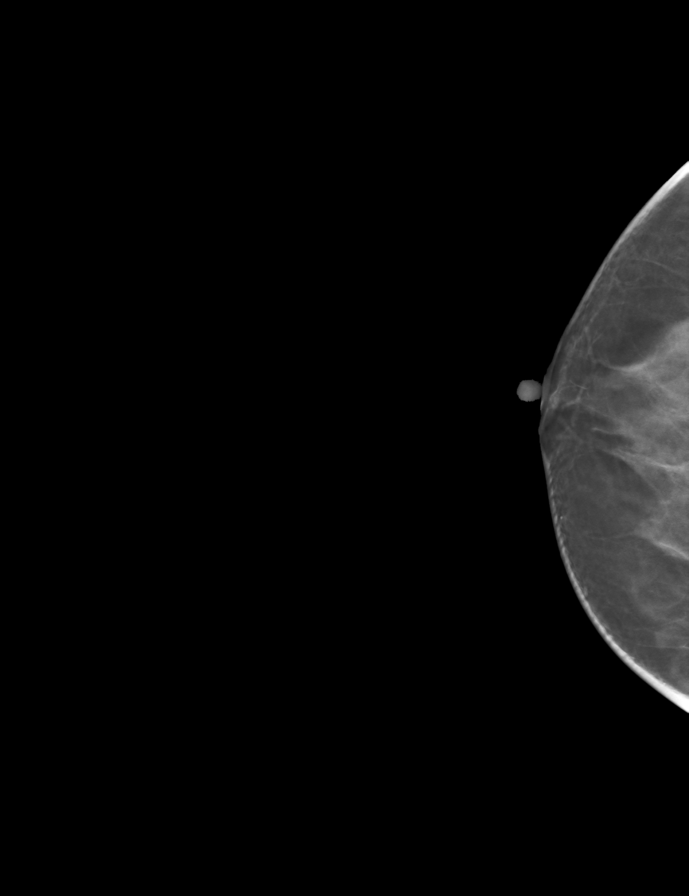

[R MLO tomo · tomo slice 27/54.0]
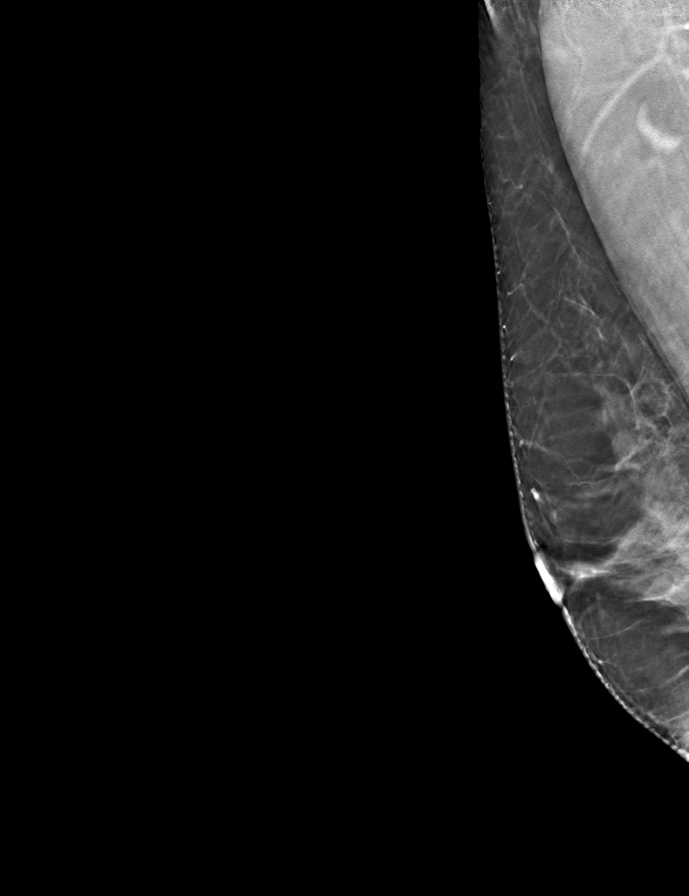

[9 of 24 positions shown; findings below may reference images not displayed]

ACR Breast Density Category c: The breast tissue is heterogeneously
dense, which may obscure small masses.
FINDINGS: There are no findings suspicious for malignancy.
IMPRESSION: No mammographic evidence of malignancy. A result letter of this
screening mammogram will be mailed directly to the patient.

RECOMMENDATION:
Screening mammogram in one year. (Code:Q3-W-BC3)

BI-RADS CATEGORY  1: Negative.

## 2022-11-13 ENCOUNTER — Ambulatory Visit: Payer: BC Managed Care – PPO | Admitting: Sports Medicine

## 2023-03-10 ENCOUNTER — Encounter (INDEPENDENT_AMBULATORY_CARE_PROVIDER_SITE_OTHER): Payer: Self-pay | Admitting: Family Medicine

## 2023-03-10 DIAGNOSIS — J440 Chronic obstructive pulmonary disease with acute lower respiratory infection: Secondary | ICD-10-CM | POA: Diagnosis not present

## 2023-03-10 DIAGNOSIS — J209 Acute bronchitis, unspecified: Secondary | ICD-10-CM | POA: Diagnosis not present

## 2023-03-11 NOTE — Telephone Encounter (Signed)

## 2023-03-16 MED ORDER — AZITHROMYCIN 250 MG PO TABS
ORAL_TABLET | ORAL | 0 refills | Status: AC
Start: 2023-03-16 — End: 2023-03-21

## 2023-03-16 NOTE — Telephone Encounter (Signed)
Please see the MyChart message reply(ies) for my assessment and plan.  The patient gave consent for this Medical Advice Message and is aware that it may result in a bill to their insurance company as well as the possibility that this may result in a co-payment or deductible. They are an established patient, but are not seeking medical advice exclusively about a problem treated during an in person or video visit in the last 7 days. I did not recommend an in person or video visit within 7 days of my reply.  I spent a total of 17 minutes cumulative time within 7 days through [employer] Lamar Blinks, MD

## 2023-03-16 NOTE — Addendum Note (Signed)
 Addended by: Pearline Cables on: 03/16/2023 07:56 PM   Modules accepted: Orders

## 2023-03-21 ENCOUNTER — Other Ambulatory Visit: Payer: Self-pay

## 2023-03-21 DIAGNOSIS — I872 Venous insufficiency (chronic) (peripheral): Secondary | ICD-10-CM

## 2023-03-28 ENCOUNTER — Ambulatory Visit (HOSPITAL_COMMUNITY)
Admission: RE | Admit: 2023-03-28 | Discharge: 2023-03-28 | Disposition: A | Discharge status: Home / Self Care | Payer: BC Managed Care – PPO | Source: Ambulatory Visit | Attending: Vascular Surgery | Admitting: Vascular Surgery

## 2023-03-28 ENCOUNTER — Ambulatory Visit (INDEPENDENT_AMBULATORY_CARE_PROVIDER_SITE_OTHER): Payer: BC Managed Care – PPO | Admitting: Physician Assistant

## 2023-03-28 VITALS — BP 143/88 | HR 69 | Temp 98.3°F | Resp 18 | Ht <= 58 in | Wt 114.6 lb

## 2023-03-28 DIAGNOSIS — I872 Venous insufficiency (chronic) (peripheral): Secondary | ICD-10-CM | POA: Diagnosis not present

## 2023-03-28 DIAGNOSIS — I8393 Asymptomatic varicose veins of bilateral lower extremities: Secondary | ICD-10-CM

## 2023-03-28 NOTE — Progress Notes (Signed)
 Requested by:  Pearline Cables, MD [address] [place],  [place] [address]  Reason for consultation: spider veins    History of Present Illness   Jade Kennedy is a 53 y.o. (01-06-1971) female who presents for evaluation of spider veins.  She was last seen in our office in 2017 with discomfort due to several spider veins and reticular veins.  She underwent successful sclerotherapy in bilateral lower extremities.  She presents today for repeat evaluation.  She states over the past several years she has developed more spider and reticular veins in bilateral lower extremities.  She denies any discomfort, pain, or itching around these areas.  She denies any bleeding events or ulcerations.  She denies any lower extremity swelling.  She would like her veins treated with sclerotherapy again because she does not like the appearance of them.  Past Medical History:  Diagnosis Date   Microcytic anemia    Migraine    Venous insufficiency     Past Surgical History:  Procedure Laterality Date   CESAREAN SECTION  2010/2011    Social History   Socioeconomic History   Marital status: Married    Spouse name: Not on file   Number of children: 3   Years of education: Not on file   Highest education level: Not on file  Occupational History   Occupation: Housewife  Tobacco Use   Smoking status: Never   Smokeless tobacco: Never  Vaping Use   Vaping status: Never Used  Substance and Sexual Activity   Alcohol use: Not Currently    Alcohol/week: 0.0 standard drinks of alcohol    Comment: Ocass-Wine    Drug use: No   Sexual activity: Yes  Other Topics Concern   Not on file  Social History Narrative   Not on file   Social Drivers of Health   Financial Resource Strain: Not on file  Food Insecurity: Not on file  Transportation Needs: Not on file  Physical Activity: Not on file  Stress: Not on file  Social Connections: Not on file  Intimate Partner Violence: Not  on file    Family History  Problem Relation Age of Onset   Heart disease Mother    Hypertension Sister    Hypertension Brother    Healthy Child        x 3 -- 2, 51 and 3 years old    Current Outpatient Medications  Medication Sig Dispense Refill   Cholecalciferol (VITAMIN D3) 1.25 MG (50000 UT) CAPS Take 1 weekly for 12 weeks 12 capsule 0   ferrous sulfate 325 (65 FE) MG tablet Take 1 tablet (325 mg total) by mouth 2 (two) times daily with a meal. 60 tablet 0   meloxicam (MOBIC) 15 MG tablet Take 1 by mouth daily with food for 5 days then as needed. 40 tablet 0   Multiple Vitamins-Iron (MULTIVITAMIN/IRON PO) Take by mouth.     hydrochlorothiazide (HYDRODIURIL) 25 MG tablet Take 0.5 tablets (12.5 mg total) by mouth daily. (Patient not taking: Reported on 03/28/2023) 90 tablet 0   Specialty Vitamins Products (ECHINACEA C COMPLETE PO) Take by mouth. (Patient not taking: Reported on 03/28/2023)     No current facility-administered medications for this visit.    No Known Allergies  REVIEW OF SYSTEMS (negative unless checked):   Cardiac:  []  Chest pain or chest pressure? []  Shortness of breath upon activity? []  Shortness of breath when lying flat? []  Irregular heart rhythm?  Vascular:  []   Pain in calf, thigh, or hip brought on by walking? []  Pain in feet at night that wakes you up from your sleep? []  Blood clot in your veins? []  Leg swelling?  Pulmonary:  []  Oxygen at home? []  Productive cough? []  Wheezing?  Neurologic:  []  Sudden weakness in arms or legs? []  Sudden numbness in arms or legs? []  Sudden onset of difficult speaking or slurred speech? []  Temporary loss of vision in one eye? []  Problems with dizziness?  Gastrointestinal:  []  Blood in stool? []  Vomited blood?  Genitourinary:  []  Burning when urinating? []  Blood in urine?  Psychiatric:  []  Major depression  Hematologic:  []  Bleeding problems? []  Problems with blood clotting?  Dermatologic:  []   Rashes or ulcers?  Constitutional:  []  Fever or chills?  Ear/Nose/Throat:  []  Change in hearing? []  Nose bleeds? []  Sore throat?  Musculoskeletal:  []  Back pain? []  Joint pain? []  Muscle pain?   Physical Examination     Vitals:   03/28/23 1418  BP: (!) 143/88  Pulse: 69  Resp: 18  Temp: 98.3 F (36.8 C)  TempSrc: Temporal  SpO2: 98%  Weight: 114 lb 9.6 oz (52 kg)  Height: 4\' 10"  (1.473 m)   Body mass index is 23.95 kg/m.  General:  WDWN in NAD; vital signs documented above Gait: Not observed HENT: WNL, normocephalic Pulmonary: normal non-labored breathing , without Rales, rhonchi,  wheezing Cardiac: regular Abdomen: soft, NT, no masses Skin: without rashes Vascular Exam/Pulses: palpable pedal pulses Extremities: Without edema or varicose veins.  Several spider and reticular veins on bilateral lower extremities, with large concentration around both popliteal fossa Musculoskeletal: no muscle wasting or atrophy  Neurologic: A&O X 3;  No focal weakness or paresthesias are detected Psychiatric:  The pt has Normal affect.  Non-invasive Vascular Imaging   LLE Venous Insufficiency Duplex (03/28/2023):  +--------------+---------+------+-----------+------------+--------+  LEFT         Reflux NoRefluxReflux TimeDiameter cmsComments                          Yes                                   +--------------+---------+------+-----------+------------+--------+  GSV at SFJ              yes    >500 ms      0.58              +--------------+---------+------+-----------+------------+--------+  GSV prox thighno                            0.57              +--------------+---------+------+-----------+------------+--------+  GSV mid thigh           yes    >500 ms      0.38              +--------------+---------+------+-----------+------------+--------+  GSV dist thighno                            0.40               +--------------+---------+------+-----------+------------+--------+  GSV at knee   no  0.33              +--------------+---------+------+-----------+------------+--------+  GSV prox calf           yes    >500 ms      0.45              +--------------+---------+------+-----------+------------+--------+  GSV mid calf            yes    >500 ms      0.30              +--------------+---------+------+-----------+------------+--------+  SSV Pop Fossa no                            0.47              +--------------+---------+------+-----------+------------+--------+  SSV prox calf no                            0.39              +--------------+---------+------+-----------+------------+--------+  SSV mid calf  no                            0.28              +--------------+---------+------+-----------+------------+--------+     Medical Decision Making   Jade Kennedy is a 53 y.o. female who presents for repeat evaluation  Based on the patient's duplex, there is reflux in the left greater saphenous vein at the saphenofemoral junction, mid thigh, proximal calf, and mid calf.  The remainder of the deep and superficial venous system is competent.  There is no evidence of DVT on exam.  She could potentially be a candidate for GSV ablation in the future should she become fairly symptomatic She has previously been seen in our office several years ago for treatment of symptomatic reticular and spider veins.  She underwent successful sclerotherapy She returns today for repeat evaluation.  She states she has developed several more reticular and spider veins in bilateral lower extremities.  These are not painful or uncomfortable for her.  She denies any issues with leg swelling.  She would like her veins treated due to the appearance On exam she has palpable pedal pulses.  She has no lower extremity edema.  She does have several spider veins on  bilateral lower extremities, with a large concentration around her popliteal fossa The patient has been examined by [place] today.  She has discussed with the patient how many vials she may potentially require for sclerotherapy.  The patient would like to pursue sclerotherapy.  She will call Harriett Sine to schedule sclerotherapy.  Otherwise she can follow-up with our office as needed   Ernestene Mention, PA-C [facility] Office: [phone]  03/28/2023, 2:22 PM  Clinic MD: Hetty Blend

## 2023-05-07 ENCOUNTER — Ambulatory Visit: Admitting: Sports Medicine

## 2023-05-07 ENCOUNTER — Ambulatory Visit (INDEPENDENT_AMBULATORY_CARE_PROVIDER_SITE_OTHER): Admitting: Sports Medicine

## 2023-05-07 ENCOUNTER — Encounter: Payer: Self-pay | Admitting: Sports Medicine

## 2023-05-07 VITALS — BP 130/88 | Ht <= 58 in | Wt 114.0 lb

## 2023-05-07 DIAGNOSIS — M722 Plantar fascial fibromatosis: Secondary | ICD-10-CM

## 2023-05-07 DIAGNOSIS — M1711 Unilateral primary osteoarthritis, right knee: Secondary | ICD-10-CM

## 2023-05-07 MED ORDER — METHYLPREDNISOLONE ACETATE 40 MG/ML IJ SUSP
40.0000 mg | Freq: Once | INTRAMUSCULAR | Status: AC
Start: 2023-05-07 — End: 2023-05-07
  Administered 2023-05-07: 40 mg via INTRA_ARTICULAR

## 2023-05-07 NOTE — Progress Notes (Signed)
   Subjective:    Patient ID: Jade Kennedy, female    DOB: September 03, 1970, 53 y.o.   MRN: [id number]  HPI  Patient presents today with persistent right knee pain.  She was seen in the office in September of last year.  She was placed on meloxicam and given home exercises.  Unfortunately she continues to have pain and swelling.  X-rays show moderate medial compartmental narrowing.  She is interested in a cortisone injection.  She is also complaining of a month of right plantar heel pain.  No injury.  Worse with prolonged standing and walking.  Review of Systems As above    Objective:   Physical Exam  Well-developed, well-nourished.  No acute distress  Right knee: Range of motion is 0 to 120 degrees.  Trace effusion.  Slight tenderness to palpation along the medial joint line.  Neurovascular intact distally.  Right heel: Slight tenderness along the calcaneal origin of the plantar fascia.  Negative calcaneal squeeze.      Assessment & Plan:   Right knee pain secondary to DJD Right heel plantar fasciitis  Right knee is injected today with cortisone.  This is accomplished atraumatically under sterile technique after risks and benefits were explained.  She tolerates this without difficulty.  An anterior lateral approach was utilized.  She will start physical therapy for both her right knee DJD and right heel plantar fasciitis.  I also recommended heel cups which she can purchase online.  Follow-up with me again in 4 weeks.  Consent obtained and verified. Time-out conducted. Noted no overlying erythema, induration, or other signs of local infection. Skin prepped in a sterile fashion. Topical analgesic spray: Ethyl chloride. Joint: Right knee Needle: 25-gauge 1.5 inch Completed without difficulty. Meds: 3 cc 1% Xylocaine, 1 cc (40 mg) Depo-Medrol  This note was dictated using Dragon naturally speaking software and may contain errors in syntax, spelling, or content which have not been  identified prior to signing this note.

## 2023-05-08 ENCOUNTER — Encounter: Payer: Self-pay | Admitting: Family Medicine

## 2023-05-08 MED ORDER — HYDROCHLOROTHIAZIDE 25 MG PO TABS: 12.5000 mg | ORAL_TABLET | Freq: Every day | ORAL | 0 refills | Status: DC

## 2023-05-08 NOTE — Addendum Note (Signed)
 Addended by: Abbe Amsterdam C on: 05/08/2023 02:28 PM   Modules accepted: Orders

## 2023-05-15 ENCOUNTER — Telehealth: Payer: Self-pay

## 2023-05-15 ENCOUNTER — Encounter: Payer: Self-pay | Admitting: Family Medicine

## 2023-05-15 DIAGNOSIS — I10 Essential (primary) hypertension: Secondary | ICD-10-CM

## 2023-05-15 DIAGNOSIS — R7303 Prediabetes: Secondary | ICD-10-CM

## 2023-05-15 DIAGNOSIS — R7401 Elevation of levels of liver transaminase levels: Secondary | ICD-10-CM

## 2023-05-15 NOTE — Telephone Encounter (Signed)
 Copied from CRM [id number]. Topic: Clinical - Request for Lab/Test Order >> May 15, 2023 12:54 PM Jade Kennedy wrote: Reason for CRM: Patient wants Dr. Geralyn Knee to schedule labs before appt on May. 1 so she can have her results at appt.

## 2023-05-22 ENCOUNTER — Encounter: Payer: Self-pay | Admitting: Radiology

## 2023-05-26 ENCOUNTER — Other Ambulatory Visit (INDEPENDENT_AMBULATORY_CARE_PROVIDER_SITE_OTHER)

## 2023-05-26 ENCOUNTER — Encounter: Payer: Self-pay | Admitting: Family Medicine

## 2023-05-26 DIAGNOSIS — R7401 Elevation of levels of liver transaminase levels: Secondary | ICD-10-CM

## 2023-05-26 DIAGNOSIS — I10 Essential (primary) hypertension: Secondary | ICD-10-CM | POA: Diagnosis not present

## 2023-05-26 DIAGNOSIS — R7303 Prediabetes: Secondary | ICD-10-CM

## 2023-05-26 LAB — COMPREHENSIVE METABOLIC PANEL WITH GFR
ALT: 57 U/L — ABNORMAL HIGH (ref 0–35)
AST: 41 U/L — ABNORMAL HIGH (ref 0–37)
Albumin: 4.3 g/dL (ref 3.5–5.2)
Alkaline Phosphatase: 94 U/L (ref 39–117)
BUN: 14 mg/dL (ref 6–23)
CO2: 33 meq/L — ABNORMAL HIGH (ref 19–32)
Calcium: 9.6 mg/dL (ref 8.4–10.5)
Chloride: 98 meq/L (ref 96–112)
Creatinine, Ser: 0.58 mg/dL (ref 0.40–1.20)
GFR: 104.02 mL/min (ref >60.00)
Glucose, Bld: 85 mg/dL (ref 70–99)
Potassium: 3.6 meq/L (ref 3.5–5.1)
Sodium: 140 meq/L (ref 135–145)
Total Bilirubin: 0.5 mg/dL (ref 0.2–1.2)
Total Protein: 7.4 g/dL (ref 6.0–8.3)

## 2023-05-26 LAB — CBC
HCT: 41.1 % (ref 36.0–46.0)
Hemoglobin: 13.3 g/dL (ref 12.0–15.0)
MCHC: 32.4 g/dL (ref 30.0–36.0)
MCV: 89 fl (ref 78.0–100.0)
Platelets: 256 10*3/uL (ref 150.0–400.0)
RBC: 4.62 Mil/uL (ref 3.87–5.11)
RDW: 13.1 % (ref 11.5–15.5)
WBC: 4.1 10*3/uL (ref 4.0–10.5)

## 2023-05-26 LAB — HEMOGLOBIN A1C: Hgb A1c MFr Bld: 6 % (ref 4.6–6.5)

## 2023-05-27 NOTE — Progress Notes (Signed)
 [facility] [address] [place], [place] [address] [phone]  Date:  05/29/2023   Name:  Geralynn Demchak   DOB:  [birth year]   MRN:  [id number]  PCP:  Kaylee Partridge, MD    Chief Complaint: Follow-up   History of Present Illness:  Saralee Gentzel is a 53 y.o. very pleasant female patient who presents with the following:  Patient seen today for blood pressure follow-up.  Most recent visit with myself was in August for her physical -history of HTN, vit D def, prediabetes, transaminitis  Works at [employer]	[place]  Her children are ages 37, 70, 67.  Her oldest is a college  At her last visit Narely noted she was not always taking hydrochlorothiazide , her blood pressure seemed to be okay without it We also been monitoring mild transaminitis-most recent complete metabolic from August was normal  She saw Dr. Lovenia Ruby with sports medicine about 3 weeks ago for knee pain, she did get a steroid injection- this did seem to help her She plans to start physical therapy also, received a call from them yesterday  It looks like Cologuard is due for update; patient would like to repeat, I ordered for her today She is taking a full 25 mg of hydrochlorothiazide  and her BP looks good today  We checked lab work 2 days ago-CMP, CBC, A1c Minimal elevation of AST and ALT is present We did do a rectal quadrant ultrasound 2 years ago which was normal She tested negative for hep A,B,C in 2023  She does not generally drink alcohol- maybe on occasion She does not regularly take tylenol   Taking vitamin D  daily Taking iron twice weekly   BP Readings from Last 3 Encounters:  05/29/23 118/78  05/07/23 130/88  03/28/23 (!) 143/88     Patient Active Problem List   Diagnosis Date Noted   Prediabetes 09/14/2021   Microcytic anemia 05/29/2015   Visit for preventive health examination 05/24/2015    Past Medical History:   Diagnosis Date   Microcytic anemia    Migraine    Venous insufficiency     Past Surgical History:  Procedure Laterality Date   CESAREAN SECTION  2010/2011    Social History   Tobacco Use   Smoking status: Never   Smokeless tobacco: Never  Vaping Use   Vaping status: Never Used  Substance Use Topics   Alcohol use: Not Currently    Alcohol/week: 0.0 standard drinks of alcohol    Comment: Ocass-Wine    Drug use: No    Family History  Problem Relation Age of Onset   Heart disease Mother    Hypertension Sister    Hypertension Brother    Healthy Child        x 3 -- 4, 50 and 65 years old    No Known Allergies  Medication list has been reviewed and updated.  Current Outpatient Medications on File Prior to Visit  Medication Sig Dispense Refill   Cholecalciferol (VITAMIN D3) 1.25 MG (50000 UT) CAPS Take 1 weekly for 12 weeks 12 capsule 0   ferrous sulfate  325 (65 FE) MG tablet Take 1 tablet (325 mg total) by mouth 2 (two) times daily with a meal. 60 tablet 0   Multiple Vitamins-Iron (MULTIVITAMIN/IRON PO) Take by mouth.     meloxicam  (MOBIC ) 15 MG tablet Take 1 by mouth daily with food for 5 days then as needed. (Patient not taking:  Reported on 05/29/2023) 40 tablet 0   Specialty Vitamins Products (ECHINACEA C COMPLETE PO) Take by mouth. (Patient not taking: Reported on 05/29/2023)     No current facility-administered medications on file prior to visit.    Review of Systems:  As per HPI- otherwise negative.   Physical Examination: Vitals:   05/29/23 0933  BP: 118/78  Pulse: 89  SpO2: 99%   Vitals:   05/29/23 0933  Weight: 115 lb (52.2 kg)  Height: 4\' 10"  (1.473 m)   Body mass index is 24.04 kg/m. Ideal Body Weight: Weight in (lb) to have BMI = 25: 119.4  GEN: no acute distress.  Normal weight, petite build.  Looks well HEENT: Atraumatic, Normocephalic.  Ears and Nose: No external deformity. CV: RRR, No M/G/R. No JVD. No thrill. No extra heart  sounds. PULM: CTA B, no wheezes, crackles, rhonchi. No retractions. No resp. distress. No accessory muscle use. ABD: S, NT, ND EXTR: No c/c/e PSYCH: Normally interactive. Conversant.    Assessment and Plan: Benign essential HTN - Plan: hydrochlorothiazide  (HYDRODIURIL ) 25 MG tablet  Screening for colon cancer - Plan: Cologuard  Transaminitis - Plan: Comprehensive metabolic panel with GFR  History of iron deficiency - Plan: Ferritin  Patient seen today for follow-up.  Blood pressure is at goal on hydrochlorothiazide  25, she will continue this dosage.  Recent electrolytes looked fine, renal function normal.  Encouraged her to incorporate high potassium foods in her diet.  We plan for follow-up in 3 to 6 months for lab visit only  Order Cologuard  Discussed mild transaminitis.  Negative hepatitis, no excessive alcohol or Tylenol use.  Patient is not eager to do a repeat ultrasound right now due to expense.  Given minimal elevation of her labs I think this is reasonable.  She plans to come in for a repeat lab check in 3 to 6 months  We will also check her iron at her next labs, she is currently taking iron twice weekly  Signed Gates Kasal, MD

## 2023-05-27 NOTE — Patient Instructions (Addendum)
 It was great to see you again today!    It looks like your Cologuard is due for an update- will order one to be sent to you   Also recommend a COVID-19 booster if none the last 9 months or so   Your BP looks good on the full 25 mg of hydrochlorothiazide  Make sure you are getting a few high potassium foods daily to maintain your potassium levels  Please schedule a lab visit only in 3-6 months and we will check your iron/ ferritin and your liver tests

## 2023-05-29 ENCOUNTER — Ambulatory Visit (INDEPENDENT_AMBULATORY_CARE_PROVIDER_SITE_OTHER): Admitting: Family Medicine

## 2023-05-29 ENCOUNTER — Encounter: Payer: Self-pay | Admitting: Family Medicine

## 2023-05-29 VITALS — BP 118/78 | HR 89 | Ht <= 58 in | Wt 115.0 lb

## 2023-05-29 DIAGNOSIS — Z1211 Encounter for screening for malignant neoplasm of colon: Secondary | ICD-10-CM | POA: Diagnosis not present

## 2023-05-29 DIAGNOSIS — I10 Essential (primary) hypertension: Secondary | ICD-10-CM

## 2023-05-29 DIAGNOSIS — Z8639 Personal history of other endocrine, nutritional and metabolic disease: Secondary | ICD-10-CM | POA: Diagnosis not present

## 2023-05-29 DIAGNOSIS — R7401 Elevation of levels of liver transaminase levels: Secondary | ICD-10-CM | POA: Diagnosis not present

## 2023-05-29 MED ORDER — HYDROCHLOROTHIAZIDE 25 MG PO TABS
25.0000 mg | ORAL_TABLET | Freq: Every day | ORAL | 3 refills | Status: DC
Start: 2023-05-29 — End: 2023-10-01

## 2023-06-04 ENCOUNTER — Ambulatory Visit: Admitting: Sports Medicine

## 2023-06-11 DIAGNOSIS — Z1211 Encounter for screening for malignant neoplasm of colon: Secondary | ICD-10-CM | POA: Diagnosis not present

## 2023-06-20 ENCOUNTER — Encounter: Payer: Self-pay | Admitting: Family Medicine

## 2023-06-20 LAB — COLOGUARD: COLOGUARD: NEGATIVE

## 2023-06-24 ENCOUNTER — Other Ambulatory Visit: Payer: Self-pay

## 2023-06-24 ENCOUNTER — Ambulatory Visit: Attending: Sports Medicine

## 2023-06-24 DIAGNOSIS — G8929 Other chronic pain: Secondary | ICD-10-CM | POA: Diagnosis not present

## 2023-06-24 DIAGNOSIS — M1711 Unilateral primary osteoarthritis, right knee: Secondary | ICD-10-CM | POA: Diagnosis not present

## 2023-06-24 DIAGNOSIS — M722 Plantar fascial fibromatosis: Secondary | ICD-10-CM | POA: Diagnosis not present

## 2023-06-24 DIAGNOSIS — M79671 Pain in right foot: Secondary | ICD-10-CM | POA: Insufficient documentation

## 2023-06-24 DIAGNOSIS — M79672 Pain in left foot: Secondary | ICD-10-CM | POA: Diagnosis not present

## 2023-06-24 DIAGNOSIS — M25561 Pain in right knee: Secondary | ICD-10-CM | POA: Diagnosis not present

## 2023-06-24 DIAGNOSIS — R262 Difficulty in walking, not elsewhere classified: Secondary | ICD-10-CM | POA: Insufficient documentation

## 2023-06-24 NOTE — Therapy (Signed)
 OUTPATIENT PHYSICAL THERAPY LOWER EXTREMITY EVALUATION   Patient Name: Jade Kennedy MRN: [id number] DOB:09/07/1970, 53 y.o., female Today's Date: 06/24/2023  END OF SESSION:  PT End of Session - 06/24/23 1303     Visit Number 1    Date for PT Re-Evaluation 08/19/23    Progress Note Due on Visit 10    PT Start Time 0933    PT Stop Time 1018    PT Time Calculation (min) 45 min    Activity Tolerance Patient tolerated treatment well    Behavior During Therapy [facility] for tasks assessed/performed             Past Medical History:  Diagnosis Date   Microcytic anemia    Migraine    Venous insufficiency    Past Surgical History:  Procedure Laterality Date   CESAREAN SECTION  2010/2011   Patient Active Problem List   Diagnosis Date Noted   Prediabetes 09/14/2021   Microcytic anemia 05/29/2015   Visit for preventive health examination 05/24/2015    PCP: Gates Kasal MD  REFERRING PROVIDER: Raydell Cahill DO  REFERRING DIAG: R knee OA and B plantarfasciitis /heel pain  THERAPY DIAG:  Pain in left foot - Plan: PT plan of care cert/re-cert  Pain in right foot - Plan: PT plan of care cert/re-cert  Chronic pain of right knee  Difficulty in walking, not elsewhere classified  Rationale for Evaluation and Treatment: Rehabilitation  ONSET DATE: several months, 2024  SUBJECTIVE:   SUBJECTIVE STATEMENT: B heels pain for several months. Hurts after sitting with immediate standing and walking for first 10 steps, also B heel pain with prolonged standing such as dishes.  The injection R knee really helped a lot, knee pain almost gone.  PERTINENT HISTORY: Referred by Dr . Lovenia Ruby after office visit.  Had injection R knee which relieved pain.   PAIN:  Are you having pain? Yes: NPRS scale: 0 to 4 Pain location: B heels , center plantar surface  Pain description: deep ache Aggravating factors: standing still and with initial walking after prolonged sitting Relieving  factors: sitting  PRECAUTIONS: None  RED FLAGS: None   WEIGHT BEARING RESTRICTIONS: No  FALLS:  Has patient fallen in last 6 months? 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  DIAGNOSTIC FINDINGS: na  PATIENT SURVEYS:  LEFS  Extreme difficulty/unable (0), Quite a bit of difficulty (1), Moderate difficulty (2), Little difficulty (3), No difficulty (4) Survey date:  06/24/23  Any of your usual work, housework or school activities 2  2. Usual hobbies, recreational or sporting activities 4  3. Getting into/out of the bath 4  4. Walking between rooms 3  5. Putting on socks/shoes 4  6. Squatting  2  7. Lifting an object, like a bag of groceries from the floor 4  8. Performing light activities around your home 4  9. Performing heavy activities around your home 3  10. Getting into/out of a car 3  11. Walking 2 blocks 4  12. Walking 1 mile 4  13. Going up/down 10 stairs (1  flight) 4  14. Standing for 1 hour 2  15.  sitting for 1 hour 4  16. Running on even ground 0  17. Running on uneven ground 0  18. Making sharp turns while running fast 0  19. Hopping  0  20. Rolling over in bed 4  Score total:  53     COGNITION: Overall cognitive status: Within functional limits for tasks assessed     SENSATION: WFL  EDEMA:  None noted  MUSCLE LENGTH: Hamstrings: Right wfl deg; Left wfl deg Andy Bannister test: Right wfl deg; Left wfl deg  POSTURE: B femurs with IR, B forefoot ER/valgum. No change in B navicular height with sit to stand,  longitudinal arch unchanged. Mild flexion B knees to 10 degrees  PALPATION: Tenderness at plantarfascia attachment R heel with deep palpation Also decreased mobility B rearfoot Windlass test 0- B Mild thickening B soleus but not painful per patient R knee jt non tender with palpation med and lat jt line and patella  LOWER EXTREMITY ROM: all ROM B ankles wnl except B ankle dorsiflexion to 12 degrees  LOWER EXTREMITY MMT: all wnl Noted with single leg heel raise increased sway, decreased control on R  LOWER EXTREMITY SPECIAL TESTS:  NA  FUNCTIONAL TESTS:  Single leg stance 20 sec each  GAIT: Distance walked: in clinic, over 59'                                                                                                                                TREATMENT DATE: 06/24/23    PATIENT EDUCATION:  Education details: POC, goals Person educated: Patient Education method: Programmer, multimedia, Demonstration, Actor cues, Verbal cues, and Handouts Education comprehension: verbalized understanding and returned demonstration  HOME EXERCISE PROGRAM: Access Code: [email or web address] URL: [email or web address] Date: 06/24/2023 Prepared by: Shaaron Dar  Exercises - Standing Ankle Dorsiflexion Stretch on Chair  - 1 x daily - 7 x weekly - 1 sets - 15 reps - Standing Bilateral Gastroc Stretch with Step  - 1 x daily - 7 x weekly - 1 sets - 1-2 reps - up to 60 sec hold  ASSESSMENT:  CLINICAL IMPRESSION: Patient is a 53 y.o. female  who was evaluated today for physical therapy due to R knee pain with osteoarthritis and B heel pain. Today we focused more on B heels as she is experiencing much relief R knee after the injection she received at her MD appt.  The most pertinent finding is stiffness B ankles for dorsiflexion and in her midfoot and subtalar jt.  She had relief of heel pain after these specific stretches in the clinic.  Would recommend once weekly follow ups, she wanted to assess how  she feels in the next week and then call to reschedule. OBJECTIVE IMPAIRMENTS: decreased balance, decreased ROM, hypomobility, and pain.   ACTIVITY LIMITATIONS: carrying, standing, squatting, and locomotion level  PARTICIPATION LIMITATIONS: meal prep, cleaning, laundry, community activity, and occupation  PERSONAL FACTORS: Age, Past/current experiences, Time since onset of injury/illness/exacerbation, and 1 comorbidity: pre diabetes are also affecting patient's functional outcome.   REHAB POTENTIAL: Good  CLINICAL DECISION MAKING: Evolving/moderate complexity  EVALUATION COMPLEXITY: Moderate   GOALS: Goals reviewed with patient? Yes  SHORT TERM GOALS: Target date: 2 weeks, 07/08/23 I home program Baseline: Goal status: INITIAL   LONG TERM GOALS: Target date: 08/19/23, 8 weeks  LEFS improve from 53 to 63 or greater Baseline:  Goal status: INITIAL  2.  B ankle dorslflexion improve from 12 to 15 degrees or greater for better gait efficiency Baseline:  Goal status: INITIAL  3.  Able to unilaterally balance on each foot greater than 30 sec for standing balance wnl Baseline: 20 sec each Goal status: INITIAL  4.  Further evaluate R knee mechanics  Baseline:  Goal status: INITIAL   PLAN:  PT FREQUENCY: 1x/week  PT DURATION: 8 weeks  PLANNED INTERVENTIONS: 97110-Therapeutic exercises, 97530- Therapeutic activity, [id number]- Neuromuscular re-education, 97535- Self Care, and 01027- Manual therapy  PLAN FOR NEXT SESSION: Reassess B ankle ROM, progress ex as needed, further assess R knee   [place] L Tashon Capp, PT, DPT, OCS 06/24/2023, 3:58 PM

## 2023-06-25 ENCOUNTER — Ambulatory Visit: Attending: Vascular Surgery

## 2023-06-25 DIAGNOSIS — I8393 Asymptomatic varicose veins of bilateral lower extremities: Secondary | ICD-10-CM

## 2023-06-25 NOTE — Progress Notes (Signed)
 Treated pts BLE spider and small reticular veins of BLE, mainly on posterior legs with Asclera 1%. Pt received a total of 4 ml/40 mg of Asclera 1%, administered with a 27 gauge butterfly needle. Pt tolerated well. Easy access, anticipate good results. She was placed in 20-30 mm Hg thigh high compression hose. Pt was given post treatment care instructions on handout and verbal. She will call if she has any questions/concerns.

## 2023-07-01 ENCOUNTER — Ambulatory Visit: Admitting: Sports Medicine

## 2023-07-28 ENCOUNTER — Telehealth: Payer: Self-pay

## 2023-07-28 NOTE — Telephone Encounter (Signed)
 Returned pt's call regarding questions about sclerotherapy aftercare. She is seeing some improvement but we discussed it is still early to see full results. I explained to her with as much as she had all over the back of BLE, she could likely stand more treatments. We did 2 vials and we discussed doing another 2 at a later date, in a few months. She is interested in seeing pictures I took prior to treatment, and I advised her to look at [facility] for those. No further questions/concerns at this time.

## 2023-09-19 ENCOUNTER — Telehealth: Payer: Self-pay

## 2023-09-19 DIAGNOSIS — E611 Iron deficiency: Secondary | ICD-10-CM

## 2023-09-19 DIAGNOSIS — R7303 Prediabetes: Secondary | ICD-10-CM

## 2023-09-19 DIAGNOSIS — Z8639 Personal history of other endocrine, nutritional and metabolic disease: Secondary | ICD-10-CM

## 2023-09-19 DIAGNOSIS — Z13220 Encounter for screening for lipoid disorders: Secondary | ICD-10-CM

## 2023-09-19 DIAGNOSIS — I10 Essential (primary) hypertension: Secondary | ICD-10-CM

## 2023-09-19 DIAGNOSIS — R7401 Elevation of levels of liver transaminase levels: Secondary | ICD-10-CM

## 2023-09-19 DIAGNOSIS — Z1329 Encounter for screening for other suspected endocrine disorder: Secondary | ICD-10-CM

## 2023-09-19 NOTE — Telephone Encounter (Signed)
 Copied from CRM [id number]. Topic: Clinical - Request for Lab/Test Order >> Sep 19, 2023 12:29 PM Jade Kennedy wrote: Reason for CRM: Patient would like to have blood work completed prior to physical. 10/01/23 please contact patient to schedule once orders are placed    CB#[phone]

## 2023-09-19 NOTE — Addendum Note (Signed)
 Addended by: WATT HARLENE BROCKS on: 09/19/2023 05:31 PM   Modules accepted: Orders

## 2023-09-26 NOTE — Progress Notes (Signed)
 [facility] [address] [place], [place] [address] [phone]  Date:  10/01/2023   Name:  Kaleya Douse   DOB:  1970/07/08   MRN:  [id number]  PCP:  Watt Harlene BROCKS, MD    Chief Complaint: Annual Exam   History of Present Illness:  Brayli Klingbeil is a 53 y.o. very pleasant female patient who presents with the following:  Patient seen today for physical exam.  I last saw her in May of this year for blood pressure follow-up -history of HTN, vit D def, prediabetes, transaminitis Works at [employer]	[place] Her children are ages 71, 39, 35.  Her oldest is a Dietitian this year  -We noted mild transaminitis back in April, can repeat today -Mammogram can be updated -Cologuard just completed -Pap last year.  Normal, but no HPV.  Plan to repeat next year -no family history of breast cancer   She plans to get her flu shot later in the season Shingrix  is done   Patient Active Problem List   Diagnosis Date Noted   Prediabetes 09/14/2021   Microcytic anemia 05/29/2015   Visit for preventive health examination 05/24/2015    Past Medical History:  Diagnosis Date   Microcytic anemia    Migraine    Venous insufficiency     Past Surgical History:  Procedure Laterality Date   CESAREAN SECTION  2010/2011    Social History   Tobacco Use   Smoking status: Never   Smokeless tobacco: Never  Vaping Use   Vaping status: Never Used  Substance Use Topics   Alcohol use: Not Currently    Alcohol/week: 0.0 standard drinks of alcohol    Comment: [employer]ine    Drug use: No    Family History  Problem Relation Age of Onset   Heart disease Mother    Hypertension Sister    Hypertension Brother    Healthy Child        x 3 -- 40, 71 and 67 years old    No Known Allergies  Medication list has been reviewed and updated.  Current Outpatient Medications on File Prior to Visit  Medication Sig Dispense Refill    Cholecalciferol (VITAMIN D3) 1.25 MG (50000 UT) CAPS Take 1 weekly for 12 weeks 12 capsule 0   ferrous sulfate  325 (65 FE) MG tablet Take 1 tablet (325 mg total) by mouth 2 (two) times daily with a meal. 60 tablet 0   Multiple Vitamins-Iron (MULTIVITAMIN/IRON PO) Take by mouth.     Specialty Vitamins Products (ECHINACEA C COMPLETE PO) Take by mouth.     meloxicam  (MOBIC ) 15 MG tablet Take 1 by mouth daily with food for 5 days then as needed. (Patient not taking: Reported on 10/01/2023) 40 tablet 0   No current facility-administered medications on file prior to visit.    Review of Systems:  As per HPI- otherwise negative.   Physical Examination: Vitals:   10/01/23 1023  BP: 116/74  Pulse: 80  Temp: (!) 97.5 F (36.4 C)  SpO2: 98%   Vitals:   10/01/23 1023  Weight: 119 lb (54 kg)  Height: 4' 10 (1.473 m)   Body mass index is 24.87 kg/m. Ideal Body Weight: Weight in (lb) to have BMI = 25: 119.4  GEN: no acute distress.  Normal weight, looks well HEENT: Atraumatic, Normocephalic.  Bilateral TM wnl, oropharynx normal.  PEERL,EOMI.   Ears and Nose: No external deformity. CV:  RRR, No M/G/R. No JVD. No thrill. No extra heart sounds. PULM: CTA B, no wheezes, crackles, rhonchi. No retractions. No resp. distress. No accessory muscle use. ABD: S, NT, ND, +BS. No rebound. No HSM. EXTR: No c/c/e PSYCH: Normally interactive. Conversant.    Assessment and Plan: Physical exam  Benign essential HTN - Plan: hydrochlorothiazide  (HYDRODIURIL ) 25 MG tablet  Transaminitis - Plan: Hepatic function panel  Prediabetes  Thyroid  disorder screening - Plan: TSH  Screening, lipid - Plan: Lipid panel  Encounter for screening mammogram for malignant neoplasm of breast - Plan: MM 3D SCREENING MAMMOGRAM BILATERAL BREAST  Patient seen today for physical exam, recommended healthy diet and exercise routine Blood pressure well-controlled on current dose of hydrochlorothiazide , refilled CMP done  just in April, follow-up on mild transaminitis today A1c was monitored recently Update lipid and thyroid  levels today Order mammogram  Will plan further follow- up pending labs.  Signed Harlene Schroeder, MD  Received labs as below, message to patient Results for orders placed or performed in visit on 10/01/23  Hepatic function panel   Collection Time: 10/01/23 11:03 AM  Result Value Ref Range   Total Bilirubin 0.6 0.2 - 1.2 mg/dL   Bilirubin, Direct 0.1 0.0 - 0.3 mg/dL   Alkaline Phosphatase 75 39 - 117 U/L   AST 32 0 - 37 U/L   ALT 36 (H) 0 - 35 U/L   Total Protein 7.3 6.0 - 8.3 g/dL   Albumin 4.2 3.5 - 5.2 g/dL  Lipid panel   Collection Time: 10/01/23 11:03 AM  Result Value Ref Range   Cholesterol 211 (H) 0 - 200 mg/dL   Triglycerides 891.9 0.0 - 149.0 mg/dL   HDL 28.49 >60.99 mg/dL   VLDL 78.3 0.0 - 59.9 mg/dL   LDL Cholesterol 881 (H) 0 - 99 mg/dL   Total CHOL/HDL Ratio 3    NonHDL 139.61   TSH   Collection Time: 10/01/23 11:03 AM  Result Value Ref Range   TSH 1.20 0.35 - 5.50 uIU/mL

## 2023-09-26 NOTE — Patient Instructions (Addendum)
 It was great to see you today, I will be in touch with your labs soon as possible Recommend a flu shot this fall at your convenience  We will do a pap for you next year Recommend mammogram at your convenience

## 2023-10-01 ENCOUNTER — Encounter: Payer: Self-pay | Admitting: Family Medicine

## 2023-10-01 ENCOUNTER — Ambulatory Visit (INDEPENDENT_AMBULATORY_CARE_PROVIDER_SITE_OTHER): Admitting: Family Medicine

## 2023-10-01 VITALS — BP 116/74 | HR 80 | Temp 97.5°F | Ht <= 58 in | Wt 119.0 lb

## 2023-10-01 DIAGNOSIS — Z0000 Encounter for general adult medical examination without abnormal findings: Secondary | ICD-10-CM | POA: Diagnosis not present

## 2023-10-01 DIAGNOSIS — R7303 Prediabetes: Secondary | ICD-10-CM | POA: Diagnosis not present

## 2023-10-01 DIAGNOSIS — R7401 Elevation of levels of liver transaminase levels: Secondary | ICD-10-CM

## 2023-10-01 DIAGNOSIS — I10 Essential (primary) hypertension: Secondary | ICD-10-CM | POA: Diagnosis not present

## 2023-10-01 DIAGNOSIS — Z1231 Encounter for screening mammogram for malignant neoplasm of breast: Secondary | ICD-10-CM

## 2023-10-01 DIAGNOSIS — Z1329 Encounter for screening for other suspected endocrine disorder: Secondary | ICD-10-CM | POA: Diagnosis not present

## 2023-10-01 DIAGNOSIS — Z13220 Encounter for screening for lipoid disorders: Secondary | ICD-10-CM

## 2023-10-01 LAB — HEPATIC FUNCTION PANEL
ALT: 36 U/L — ABNORMAL HIGH (ref 0–35)
AST: 32 U/L (ref 0–37)
Albumin: 4.2 g/dL (ref 3.5–5.2)
Alkaline Phosphatase: 75 U/L (ref 39–117)
Bilirubin, Direct: 0.1 mg/dL (ref 0.0–0.3)
Total Bilirubin: 0.6 mg/dL (ref 0.2–1.2)
Total Protein: 7.3 g/dL (ref 6.0–8.3)

## 2023-10-01 LAB — LIPID PANEL
Cholesterol: 211 mg/dL — ABNORMAL HIGH (ref 0–200)
HDL: 71.5 mg/dL (ref >39.00)
LDL Cholesterol: 118 mg/dL — ABNORMAL HIGH (ref 0–99)
NonHDL: 139.61
Total CHOL/HDL Ratio: 3
Triglycerides: 108 mg/dL (ref 0.0–149.0)
VLDL: 21.6 mg/dL (ref 0.0–40.0)

## 2023-10-01 LAB — TSH: TSH: 1.2 u[IU]/mL (ref 0.35–5.50)

## 2023-10-01 MED ORDER — HYDROCHLOROTHIAZIDE 25 MG PO TABS: 25.0000 mg | ORAL_TABLET | Freq: Every day | ORAL | 3 refills | Status: AC

## 2023-11-05 ENCOUNTER — Telehealth (HOSPITAL_BASED_OUTPATIENT_CLINIC_OR_DEPARTMENT_OTHER): Payer: Self-pay
